# Patient Record
Sex: Female | Born: 1955 | Race: White | Hispanic: No | Marital: Married | Smoking: Never smoker
Health system: Southern US, Community
[De-identification: ages and names within clinical notes are randomized; demographics above are authoritative.]

## PROBLEM LIST (undated history)

## (undated) DIAGNOSIS — G43909 Migraine, unspecified, not intractable, without status migrainosus: Secondary | ICD-10-CM

## (undated) DIAGNOSIS — T7840XA Allergy, unspecified, initial encounter: Secondary | ICD-10-CM

## (undated) DIAGNOSIS — E785 Hyperlipidemia, unspecified: Secondary | ICD-10-CM

## (undated) DIAGNOSIS — F419 Anxiety disorder, unspecified: Secondary | ICD-10-CM

## (undated) DIAGNOSIS — G2581 Restless legs syndrome: Secondary | ICD-10-CM

## (undated) HISTORY — DX: Hyperlipidemia, unspecified: E78.5

## (undated) HISTORY — DX: Restless legs syndrome: G25.81

## (undated) HISTORY — PX: ABDOMINAL HYSTERECTOMY: SHX81

## (undated) HISTORY — DX: Migraine, unspecified, not intractable, without status migrainosus: G43.909

## (undated) HISTORY — DX: Allergy, unspecified, initial encounter: T78.40XA

## (undated) HISTORY — DX: Anxiety disorder, unspecified: F41.9

---

## 1998-06-22 ENCOUNTER — Other Ambulatory Visit: Admission: RE | Admit: 1998-06-22 | Discharge: 1998-06-22 | Payer: Self-pay | Admitting: Obstetrics and Gynecology

## 1999-06-27 ENCOUNTER — Other Ambulatory Visit: Admission: RE | Admit: 1999-06-27 | Discharge: 1999-06-27 | Payer: Self-pay | Admitting: Obstetrics and Gynecology

## 1999-08-03 ENCOUNTER — Encounter: Payer: Self-pay | Admitting: Surgery

## 1999-08-03 ENCOUNTER — Encounter: Admission: RE | Admit: 1999-08-03 | Discharge: 1999-08-03 | Payer: Self-pay | Admitting: Surgery

## 2000-06-17 ENCOUNTER — Other Ambulatory Visit: Admission: RE | Admit: 2000-06-17 | Discharge: 2000-06-17 | Payer: Self-pay | Admitting: Obstetrics and Gynecology

## 2000-08-05 ENCOUNTER — Encounter: Admission: RE | Admit: 2000-08-05 | Discharge: 2000-08-05 | Payer: Self-pay | Admitting: Surgery

## 2000-08-05 ENCOUNTER — Encounter: Payer: Self-pay | Admitting: Surgery

## 2001-06-24 ENCOUNTER — Other Ambulatory Visit: Admission: RE | Admit: 2001-06-24 | Discharge: 2001-06-24 | Payer: Self-pay | Admitting: Obstetrics and Gynecology

## 2001-08-06 ENCOUNTER — Encounter: Admission: RE | Admit: 2001-08-06 | Discharge: 2001-08-06 | Payer: Self-pay | Admitting: Surgery

## 2001-08-06 ENCOUNTER — Encounter: Payer: Self-pay | Admitting: Surgery

## 2002-08-11 ENCOUNTER — Encounter: Admission: RE | Admit: 2002-08-11 | Discharge: 2002-08-11 | Payer: Self-pay | Admitting: Obstetrics and Gynecology

## 2002-08-11 ENCOUNTER — Encounter: Payer: Self-pay | Admitting: Obstetrics and Gynecology

## 2003-08-10 ENCOUNTER — Encounter: Admission: RE | Admit: 2003-08-10 | Discharge: 2003-08-10 | Payer: Self-pay | Admitting: Family Medicine

## 2003-08-25 ENCOUNTER — Encounter: Admission: RE | Admit: 2003-08-25 | Discharge: 2003-08-25 | Payer: Self-pay | Admitting: Surgery

## 2004-09-07 ENCOUNTER — Encounter: Admission: RE | Admit: 2004-09-07 | Discharge: 2004-09-07 | Payer: Self-pay | Admitting: Surgery

## 2004-09-14 ENCOUNTER — Encounter: Admission: RE | Admit: 2004-09-14 | Discharge: 2004-09-14 | Payer: Self-pay | Admitting: Surgery

## 2005-09-24 ENCOUNTER — Encounter: Admission: RE | Admit: 2005-09-24 | Discharge: 2005-09-24 | Payer: Self-pay | Admitting: Surgery

## 2005-09-25 ENCOUNTER — Encounter: Admission: RE | Admit: 2005-09-25 | Discharge: 2005-09-25 | Payer: Self-pay | Admitting: Surgery

## 2006-09-26 ENCOUNTER — Ambulatory Visit (HOSPITAL_COMMUNITY): Admission: RE | Admit: 2006-09-26 | Discharge: 2006-09-27 | Payer: Self-pay | Admitting: Obstetrics and Gynecology

## 2006-09-26 ENCOUNTER — Encounter (INDEPENDENT_AMBULATORY_CARE_PROVIDER_SITE_OTHER): Payer: Self-pay | Admitting: Obstetrics and Gynecology

## 2006-10-08 ENCOUNTER — Encounter: Admission: RE | Admit: 2006-10-08 | Discharge: 2006-10-08 | Payer: Self-pay | Admitting: Surgery

## 2007-10-09 ENCOUNTER — Encounter: Admission: RE | Admit: 2007-10-09 | Discharge: 2007-10-09 | Payer: Self-pay | Admitting: Surgery

## 2008-10-11 ENCOUNTER — Encounter: Admission: RE | Admit: 2008-10-11 | Discharge: 2008-10-11 | Payer: Self-pay | Admitting: Surgery

## 2009-08-05 ENCOUNTER — Encounter: Admission: RE | Admit: 2009-08-05 | Discharge: 2009-08-05 | Payer: Self-pay | Admitting: Surgery

## 2009-09-06 ENCOUNTER — Ambulatory Visit (HOSPITAL_COMMUNITY): Admission: RE | Admit: 2009-09-06 | Discharge: 2009-09-06 | Payer: Self-pay | Admitting: Surgery

## 2009-10-12 ENCOUNTER — Encounter: Admission: RE | Admit: 2009-10-12 | Discharge: 2009-10-12 | Payer: Self-pay | Admitting: Surgery

## 2010-04-23 ENCOUNTER — Encounter: Payer: Self-pay | Admitting: Surgery

## 2010-08-15 NOTE — H&P (Signed)
NAME:  Cindy Combs, Cindy Combs              ACCOUNT NO.:  [id number]   MEDICAL RECORD NO.:  [id number]          PATIENT TYPE:  AMB   LOCATION:  SDC                           FACILITY:  [facility]   PHYSICIAN:  Malachi Pro. Ambrose Mantle, M.D. DATE OF BIRTH:  1956-01-06   DATE OF ADMISSION:  DATE OF DISCHARGE:                              HISTORY & PHYSICAL   PRESENT ILLNESS:  This is a 55 year old white female, 3-0-0-3, who was  admitted to the hospital for abdominal hysterectomy, bilateral salpingo-  oophorectomy because of persistent abnormal uterine bleeding  unresponsive to medical therapy and declining dilatation and curettage,  preferring to proceed with hysterectomy. This patient has not had any  bleeding in the last week or so. Prior to that she had been evaluated  for abnormal uterine bleeding in November  of 2007, at which time a  Pipelle of the endometrial cavity showed proliferative endometrium. At  that time she stated that she had had abnormal bleeding for 5 weeks, was  passing large clots, but some days her bleeding was light. She was  treated with progesterone for 10 days and on March 07, 2006, she  called back claiming to be bleeding and changing tampons every hour. She  was advised to check her hemoglobin and her hemoglobin was 10.5 in the  office but at [facility] was 11.1. She was advised to repeat it but did  not do so.   In April of 2008, the patient called complaining of heavy bleeding. She  requested a hemoglobin and she underwent a hemoglobin, it was 11.2,  hematocrit 33.5. She was advised iron pills and repeat her CBC in 6  weeks. On Aug 01, 2006, her husband called claiming that the patient had  been bleeding daily without stopping for 80 days. I saw her on May 6th  and discussed the situation with her. She claimed that she had been  bleeding daily for 80 days. She complained that while at work she would  stand up and gush blood out of her vagina. She also stated that she  would be  wiped out at work. A repeat hemoglobin Aug 29, 2006, was still  11.2. The patient was offered D and C and hysteroscopy or hysterectomy.  She chose to proceed with hysterectomy because over the last year and a  half we had done 2 endometrial biopsies and tried repeated bouts of  progesterone therapy without success. Her uterus seems to be somewhat  enlarged and a diagnosis of adenomyosis is entertained but not definite.   PAST MEDICAL HISTORY:  Reveals no known drug allergies, no latex  allergies. She has had no surgeries, no significant adult illnesses  other than the present problem with persistent abnormal uterine  bleeding.   REVIEW OF SYSTEMS:  No visual problems, no heart condition, no bowel or  urinary problems.   SOCIAL HISTORY:  The patient does not drink or smoke. She is a Psychologist, sport and exercise for [employer].   FAMILY HISTORY:  Her father is 56, healthy, but he does have macular  degeneration. Her mother is 72 and healthy. A  47 year old sister is  healthy. A 80 year old sister also is healthy and a 44 year old brother  is healthy.   The patient has 3 children, 55 year old female, 55 year old female and a  55 year old female all in good health.   MEDICATIONS:  The patient's  medications are  1. Zoloft.  2. Zocor.   PHYSICAL EXAMINATION:  GENERAL APPEARANCE: Well-developed, well-  nourished white female in no acute distress.  HEENT: Show no significant abnormalities. The nose and pharynx are  clear.  VITAL SIGNS: Her blood pressure is 140/84, weight is 173, pulse is 80.  NECK: The neck is supple without thyromegaly.  HEART: Normal size and sounds. No murmurs.  LUNGS: The lungs were clear to auscultation.  BREASTS: The breasts are soft without masses.  ABDOMEN: The abdomen is soft and flat. It is somewhat obese. It does not  pull up well so I may not do a closed laparoscopy. Liver, spleen and  kidneys are not felt.  PELVIC: On exam the vulva and vagina are  clean. There is a tight  subpubic angle, 80 to 90 degrees, rather than the usual 100 degrees to  120 degrees. The cervix is clean. The uterus is anterior, thought to be  upper limit of normal size. The adnexa are free of masses.  RECTAL: Exam a year ago was negative.  GU: Pap smear at that time was within normal limits.   ADMITTING IMPRESSION:  1. Persistent abnormal uterine bleeding in spite of repeated bouts of      progesterone therapy, probable adenomyosis. The patient is admitted      for attempted laparoscopic-assisted vaginal hysterectomy and      bilateral salpingo-oophorectomy. The patient understands that this      may be converted to a laparotomy. She does want her ovaries      removed. She has been counseled about the risks of surgery      including, but not limited to, heart attack, stroke, pulmonary      embolus, wound disruption, hemorrhage with need for reoperation      and/or transfusions, nerve injury and intestinal obstruction. She      also understands that the surgery may produce a decreased sex      drive, although the patient claims that her sex drive is low      anyway. She is ready to proceed.  2. Anemia.      Malachi Pro. Ambrose Mantle, M.D.  Electronically Signed     TFH/MEDQ  D:  09/25/2006  T:  09/25/2006  Job:  [id number]

## 2010-08-15 NOTE — Discharge Summary (Signed)
NAME:  Cindy Combs, Cindy Combs              ACCOUNT NO.:  [id number]   MEDICAL RECORD NO.:  [id number]          PATIENT TYPE:  OIB   LOCATION:  9304                          [facility]ACILITY:  [facility]   PHYSICIAN:  Malachi Pro. Ambrose Mantle, M.D. DATE OF BIRTH:  1955/07/05   DATE OF ADMISSION:  09/26/2006  DATE OF DISCHARGE:  09/27/2006                               DISCHARGE SUMMARY   A 55 year old white female with persistent menometrorrhagia in spite of  repeated courses of progesterone and anemia admitted for laparoscopic-  assisted vaginal hysterectomy and bilateral salpingo-oophorectomy.  The  patient underwent that procedure on September 26, 2006 by Dr. Ambrose Mantle with Dr.  Jackelyn Knife assisting under general anesthesia.  Postoperatively, the  patient did well.  She had no significant problems.  Her output was  good.  Her abdomen remained soft and nontender.  She did not pass  flatus, but she was tolerating liquids, ambulating well and voiding well  and was ready for discharge on the first postoperative day.  Initial  hemoglobin 11.4, hematocrit 34.7, white count 7600, platelet count  419,000.  Follow-up hematocrits 31.5 and 27.7.  Comprehensive metabolic  profile was normal except for glucose of 100 and a potassium of 3.2.  Urine pregnancy test was negative.   FINAL DIAGNOSES:  1. Recurrent and persistent menometrorrhagia.  2. Anemia.  3. Hypokalemia on admission.   OPERATION:  Laparoscopic-assisted vaginal hysterectomy and bilateral  salpingo-oophorectomy.   FINAL CONDITION:  Improved.   INSTRUCTIONS:  Include regular diet as soon as passing flatus.  Concentrate on clear liquids prior to that.  No heavy lifting or  strenuous activity.  Call with any temperature elevation greater than  100.4 degrees.  Call with any unusual problems.  Avoid vaginal entrance  for 6 weeks.  Percocet 5/325 36 tablets 1 every 4-6 hours as needed for  pain.  Continue her Zocor and Zoloft and return to the office in 10 days  for  follow-up examination.      Malachi Pro. Ambrose Mantle, M.D.  Electronically Signed     TFH/MEDQ  D:  09/27/2006  T:  09/27/2006  Job:  [id number]

## 2010-08-15 NOTE — Op Note (Signed)
NAME:  Cindy Combs, Cindy Combs              ACCOUNT NO.:  [id number]   MEDICAL RECORD NO.:  [id number]          PATIENT TYPE:  AMB   LOCATION:  SDC                           [facility]   PHYSICIAN:  Malachi Pro. Ambrose Mantle, M.D. DATE OF BIRTH:  [birth year]   DATE OF PROCEDURE:  09/26/2006  DATE OF DISCHARGE:                               OPERATIVE REPORT   PREOPERATIVE DIAGNOSIS:  Persistent abnormal uterine bleeding with  menorrhagia and menometrorrhagia, anemia, hemoglobin 11.2.   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   OPERATION:  Laparoscopic-assisted vaginal hysterectomy and BSO.   OPERATOR:  Dr. Ambrose Mantle   ASSISTANT:  Zenaida Niece, M.D.   ANESTHESIA:  General anesthesia.   DESCRIPTION OF PROCEDURE:  The patient was brought to the operating room  and placed under satisfactory general anesthesia. The abdomen, vulva,  vagina and urethra were prepped with Betadine solution.  The uterus was  anterior upper limit of normal size.  The adnexa were free of masses.  The cul-de-sac felt free. The bladder was drained with a Foley catheter  after prepping the urethra.  A Hulka cannula was placed into the uterus  and attached to the anterior cervical lip. The abdomen was then draped  as a sterile field and the laparoscopic-assisted vaginal hysterectomy  drape was used. A small incision was made in the inferior portion of the  umbilicus. A Veress cannula  was placed through the incisional opening  but I did not reach the peritoneal cavity, so the Excel trocar was  placed into the abdominal cavity by Dr. Jackelyn Knife under direct vision.  A pneumoperitoneum was established. Additional ports were placed  laterally at approximately just below the level of the umbilicus under  direct vision.  The survey of the abdominal  cavity showed the upper  abdomen to be free of disease and the liver looks smooth.  I did not see  the gallbladder. Exploration of the pelvis revealed the endometriosis  implants in the posterior cul-de-sac but they did not tent the rectum  anteriorly.  There was the implant of endometriosis in the right  anterior cul-de-sac.  The uterus itself appeared normal possibly upper  limit of normal size.  The right tube and ovary and the left tube and  ovary were normal.  Beginning on the right, the infundibulopelvic  ligament was desiccated and then cut. The right round ligament was  desiccated and cut. Parametrial tissues were handled in the same fashion  with the tripolar instrument.  There was some bleeding during this  procedure but it was controlled with coagulation. The identical  procedure was performed on the left and after we reached approximately  the mid uterus and hemostasis was adequate.  We went below placed the  patient in lithotomy position.  The cervix was exposed and drawn into  the operative field after the Hulka cannula was removed.  A dilute  solution of Neo-Synephrine was injected circumferentially around the  cervicovaginal junction.  An  incision was then made around the  cervicovaginal junction.  The bladder was pushed anteriorly.  The  posterior cul-de-sac was entered without difficulty. Uterosacral  ligaments were clamped, cut, suture ligated and held. The cardinal  ligaments were clamped, cut and suture ligated. Additional bite was  taken above there. There was no problem at this point. We did see blood  coming out of the abdominal cavity but not an excessive amount. Finally  I was able to connect the upper and lower sites and the uterus, tubes  and ovaries were removed.  I ran the posterior vaginal cuff with a  suture of 0 Vicryl in a locking fashion from uterosacral ligament to  uterosacral ligament.  I then made a search for hemostasis.  There  seemed to be a  little bit of bleeding along the right broad ligament  that was controlled with three figure-of-eight sutures of 0 Vicryl.  There did not appear to be any additional bleeding.  I sutured the  uterosacral ligaments together in the midline and then tied them the  midline using one of the ties from each side where the uterosacral  ligaments had been held. At this point hemostasis appeared adequate.  I  closed the vaginal cuff with multiple interrupted figure-of-eight  sutures in a vertical fashion.  Hemostasis was complete. I placed some  methylene blue stained fluid in the bladder.  We looked from above and  there was some blood clot high on the left approximately underneath  where the left trocar had been placed. We tried to remove as much of the  clot as could, saw no evidence of any active bleeding.  There was no  significant bleeding around the trocar itself.  We removed as much clot  from the abdominal cavity as possible, inspected all the surgical sites  starting with the left infundibulopelvic ligament.  The left round  ligament area.  The vaginal cuff.  The right round ligament and right  infundibulopelvic ligament areas and saw no problem. We could see the  right ureter peristalsing but I did not see the left. On the left side  there seemed to be no time when we were anywhere close to the ureter. At  this point the accessory trocar sites.  The trocars were removed.  There  was no bleeding from either port. I removed the umbilical trocar and  under direct vision saw there was no active bleeding. Identified the  fascia in the trocar site and sutured it together with a suture of 0  Vicryl.  I then closed the umbilical skin with 3-0 plain catgut  interrupted sutures and closed the accessory ports with Steri-Strips.  The patient seemed to tolerate the procedure well.  Blood loss was  estimated 300 mL.  Sponge and needle counts were correct and she was  returned to recovery in  satisfactory condition.  The urine was blue from  where it had been injected into the bladder.  There was no leakage seen  in the abdominal cavity and the urine in the Foley was completely yellow  and clear.      Malachi Pro. Ambrose Mantle, M.D.  Electronically Signed     TFH/MEDQ  D:  09/26/2006  T:  09/26/2006  Job:  [id number]

## 2010-08-25 ENCOUNTER — Ambulatory Visit: Payer: Self-pay | Admitting: Family Medicine

## 2010-09-21 ENCOUNTER — Other Ambulatory Visit (INDEPENDENT_AMBULATORY_CARE_PROVIDER_SITE_OTHER): Payer: Self-pay | Admitting: Surgery

## 2010-09-21 DIAGNOSIS — Z1231 Encounter for screening mammogram for malignant neoplasm of breast: Secondary | ICD-10-CM

## 2010-10-26 ENCOUNTER — Ambulatory Visit
Admission: RE | Admit: 2010-10-26 | Discharge: 2010-10-26 | Disposition: A | Discharge status: Home / Self Care | Payer: BC Managed Care – PPO | Source: Ambulatory Visit | Attending: Surgery | Admitting: Surgery

## 2010-10-26 DIAGNOSIS — Z1231 Encounter for screening mammogram for malignant neoplasm of breast: Secondary | ICD-10-CM

## 2011-01-17 LAB — COMPREHENSIVE METABOLIC PANEL
ALT: 14
AST: 25
Alkaline Phosphatase: 38 — ABNORMAL LOW
BUN: 9
Calcium: 8.8
GFR calc non Af Amer: >60
Total Bilirubin: 0.6

## 2011-01-17 LAB — DIFFERENTIAL
Basophils Absolute: 0.1
Eosinophils Absolute: 0.3
Lymphocytes Relative: 24
Lymphs Abs: 1.8
Monocytes Relative: 5
Neutro Abs: 5.1

## 2011-01-17 LAB — CBC
HCT: 27.7 — ABNORMAL LOW
HCT: 31.5 — ABNORMAL LOW
HCT: 34.7 — ABNORMAL LOW
Hemoglobin: 10.2 — ABNORMAL LOW
MCHC: 32.9
MCV: 81.8
MCV: 82.4
Platelets: 365
Platelets: 419 — ABNORMAL HIGH
RBC: 3.36 — ABNORMAL LOW
RDW: 14.5 — ABNORMAL HIGH
WBC: 12.8 — ABNORMAL HIGH

## 2011-01-17 LAB — PREGNANCY, URINE: Preg Test, Ur: NEGATIVE

## 2011-05-16 ENCOUNTER — Other Ambulatory Visit (INDEPENDENT_AMBULATORY_CARE_PROVIDER_SITE_OTHER): Payer: Self-pay | Admitting: Surgery

## 2011-05-16 ENCOUNTER — Encounter (INDEPENDENT_AMBULATORY_CARE_PROVIDER_SITE_OTHER): Payer: Self-pay | Admitting: Surgery

## 2011-05-16 MED ORDER — HYDROCOD POLST-CHLORPHEN POLST 10-8 MG/5ML PO LQCR: 5.0000 mL | Freq: Two times a day (BID) | ORAL | Status: DC

## 2011-05-16 NOTE — Progress Notes (Signed)
Patient ID: Cindy Combs, female   DOB: [birth year], 56 y.o.   MRN: [id number] URI with fever, cough, congestion.  Better but coughs incessantly when lying down Will Rx Tussinex.  Daphine Deutscher

## 2011-10-23 ENCOUNTER — Other Ambulatory Visit (INDEPENDENT_AMBULATORY_CARE_PROVIDER_SITE_OTHER): Payer: Self-pay | Admitting: Surgery

## 2011-10-23 DIAGNOSIS — Z1231 Encounter for screening mammogram for malignant neoplasm of breast: Secondary | ICD-10-CM

## 2011-10-30 ENCOUNTER — Ambulatory Visit: Payer: BC Managed Care – PPO

## 2011-11-02 ENCOUNTER — Ambulatory Visit: Payer: BC Managed Care – PPO

## 2011-11-08 ENCOUNTER — Ambulatory Visit
Admission: RE | Admit: 2011-11-08 | Discharge: 2011-11-08 | Disposition: A | Discharge status: Home / Self Care | Payer: PRIVATE HEALTH INSURANCE | Source: Ambulatory Visit | Attending: Surgery | Admitting: Surgery

## 2011-11-08 DIAGNOSIS — Z1231 Encounter for screening mammogram for malignant neoplasm of breast: Secondary | ICD-10-CM

## 2012-04-21 ENCOUNTER — Ambulatory Visit: Payer: PRIVATE HEALTH INSURANCE | Admitting: Family Medicine

## 2012-04-22 ENCOUNTER — Encounter: Payer: Self-pay | Admitting: Family Medicine

## 2012-04-22 ENCOUNTER — Ambulatory Visit (INDEPENDENT_AMBULATORY_CARE_PROVIDER_SITE_OTHER): Payer: PRIVATE HEALTH INSURANCE | Admitting: Family Medicine

## 2012-04-22 VITALS — BP 120/84 | Temp 97.9°F | Ht 62.25 in | Wt 196.0 lb

## 2012-04-22 DIAGNOSIS — J3489 Other specified disorders of nose and nasal sinuses: Secondary | ICD-10-CM

## 2012-04-22 DIAGNOSIS — J309 Allergic rhinitis, unspecified: Secondary | ICD-10-CM

## 2012-04-22 MED ORDER — PREDNISONE 20 MG PO TABS: ORAL_TABLET | ORAL | Status: DC

## 2012-04-22 NOTE — Patient Instructions (Signed)
Take the prednisone as directed  I would also recommend you take at the 57 year old carpet in your bedroom  Call Dr. Narda Bonds for an ENT consultation because of the marked edema of the turbinates

## 2012-04-22 NOTE — Progress Notes (Signed)
  Subjective:    Patient ID: Cindy Combs, female    DOB: [birth year], 57 y.o.   MRN: [id number]  HPI Cindy Combs  is a 57 year old married female nonsmoker who comes in today as a new patient,,,,,,,, her husband Jonny Ruiz is a patient of mine,,,,,,, for evaluation of recurrent "" sinusitis"  She states she has a history of allergic rhinitis and has been on allergy shots in the past. At one time she was told she also might have asthma.  In October she went to an urgent care clinic with symptoms of head congestion and was given amoxicillin this did not really help. She then went to see Dr. Archer Callas the allergist who gave her Avelox and a shot of prednisone. This really helped her symptoms. But it wasn't very long before she was stopped up again. She then went to an another urgent care  and was given Augmentin. This also did not help.  She's always been in excellent health she said no chronic health problems. She has 3 children prior to her TAH and BSO for nonmalignant reasons. She has a history of migraine headaches for which she takes Relpax when necessary  She takes Zoloft 50 mg each bedtime from Dr. 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   Review of Systems Review of systems otherwise negative except for trauma of her nose when she was a child also they have carpeting in the bedroom its about 57 years old    Objective:   Physical Exam Well-developed well-nourished female no acute distress examination of the HEENT was negative except the absence of the septum. She has marked nasal turbinate edema. The neck was supple thyroid was not enlarged no adenopathy lungs are clear  Also did a skin exam of her back chest and abdomen all of which were normal except for scar on her left areola where she had a lesion removed years ago by Dr. 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

## 2012-05-05 ENCOUNTER — Encounter (INDEPENDENT_AMBULATORY_CARE_PROVIDER_SITE_OTHER): Payer: PRIVATE HEALTH INSURANCE | Admitting: Ophthalmology

## 2012-05-05 DIAGNOSIS — H33309 Unspecified retinal break, unspecified eye: Secondary | ICD-10-CM

## 2012-05-05 DIAGNOSIS — H3530 Unspecified macular degeneration: Secondary | ICD-10-CM

## 2012-05-05 DIAGNOSIS — H2510 Age-related nuclear cataract, unspecified eye: Secondary | ICD-10-CM

## 2012-05-05 DIAGNOSIS — H43819 Vitreous degeneration, unspecified eye: Secondary | ICD-10-CM

## 2012-05-13 ENCOUNTER — Ambulatory Visit (INDEPENDENT_AMBULATORY_CARE_PROVIDER_SITE_OTHER): Payer: PRIVATE HEALTH INSURANCE | Admitting: Ophthalmology

## 2012-05-13 DIAGNOSIS — H33309 Unspecified retinal break, unspecified eye: Secondary | ICD-10-CM

## 2012-05-14 ENCOUNTER — Ambulatory Visit (INDEPENDENT_AMBULATORY_CARE_PROVIDER_SITE_OTHER): Payer: PRIVATE HEALTH INSURANCE | Admitting: Ophthalmology

## 2012-05-26 ENCOUNTER — Ambulatory Visit (INDEPENDENT_AMBULATORY_CARE_PROVIDER_SITE_OTHER): Payer: PRIVATE HEALTH INSURANCE | Admitting: Ophthalmology

## 2012-05-30 ENCOUNTER — Other Ambulatory Visit: Payer: Self-pay | Admitting: Otolaryngology

## 2012-05-30 DIAGNOSIS — J329 Chronic sinusitis, unspecified: Secondary | ICD-10-CM

## 2012-06-03 ENCOUNTER — Ambulatory Visit
Admission: RE | Admit: 2012-06-03 | Discharge: 2012-06-03 | Disposition: A | Discharge status: Home / Self Care | Payer: PRIVATE HEALTH INSURANCE | Source: Ambulatory Visit | Attending: Otolaryngology | Admitting: Otolaryngology

## 2012-06-03 DIAGNOSIS — J329 Chronic sinusitis, unspecified: Secondary | ICD-10-CM

## 2012-07-17 ENCOUNTER — Other Ambulatory Visit: Payer: Self-pay | Admitting: Otolaryngology

## 2012-09-12 ENCOUNTER — Ambulatory Visit (INDEPENDENT_AMBULATORY_CARE_PROVIDER_SITE_OTHER): Payer: PRIVATE HEALTH INSURANCE | Admitting: Ophthalmology

## 2012-09-18 ENCOUNTER — Ambulatory Visit (INDEPENDENT_AMBULATORY_CARE_PROVIDER_SITE_OTHER): Payer: Self-pay | Admitting: Ophthalmology

## 2012-09-30 ENCOUNTER — Ambulatory Visit (INDEPENDENT_AMBULATORY_CARE_PROVIDER_SITE_OTHER): Payer: PRIVATE HEALTH INSURANCE | Admitting: Ophthalmology

## 2012-09-30 DIAGNOSIS — H43819 Vitreous degeneration, unspecified eye: Secondary | ICD-10-CM

## 2012-09-30 DIAGNOSIS — H33309 Unspecified retinal break, unspecified eye: Secondary | ICD-10-CM

## 2012-09-30 DIAGNOSIS — H2510 Age-related nuclear cataract, unspecified eye: Secondary | ICD-10-CM

## 2012-09-30 DIAGNOSIS — H3530 Unspecified macular degeneration: Secondary | ICD-10-CM

## 2012-10-02 ENCOUNTER — Other Ambulatory Visit: Payer: Self-pay

## 2012-10-02 DIAGNOSIS — Z1231 Encounter for screening mammogram for malignant neoplasm of breast: Secondary | ICD-10-CM

## 2012-11-10 ENCOUNTER — Ambulatory Visit
Admission: RE | Admit: 2012-11-10 | Discharge: 2012-11-10 | Disposition: A | Discharge status: Home / Self Care | Payer: PRIVATE HEALTH INSURANCE | Source: Ambulatory Visit

## 2012-11-10 DIAGNOSIS — Z1231 Encounter for screening mammogram for malignant neoplasm of breast: Secondary | ICD-10-CM

## 2013-09-22 ENCOUNTER — Other Ambulatory Visit: Payer: Self-pay

## 2013-09-22 DIAGNOSIS — Z1231 Encounter for screening mammogram for malignant neoplasm of breast: Secondary | ICD-10-CM

## 2013-11-12 ENCOUNTER — Ambulatory Visit
Admission: RE | Admit: 2013-11-12 | Discharge: 2013-11-12 | Disposition: A | Discharge status: Home / Self Care | Payer: PRIVATE HEALTH INSURANCE | Source: Ambulatory Visit

## 2013-11-12 DIAGNOSIS — Z1231 Encounter for screening mammogram for malignant neoplasm of breast: Secondary | ICD-10-CM

## 2013-12-24 ENCOUNTER — Other Ambulatory Visit: Payer: Self-pay | Admitting: Family Medicine

## 2013-12-24 DIAGNOSIS — R1013 Epigastric pain: Secondary | ICD-10-CM

## 2013-12-30 ENCOUNTER — Ambulatory Visit
Admission: RE | Admit: 2013-12-30 | Discharge: 2013-12-30 | Disposition: A | Discharge status: Home / Self Care | Payer: PRIVATE HEALTH INSURANCE | Source: Ambulatory Visit | Attending: Family Medicine | Admitting: Family Medicine

## 2013-12-30 DIAGNOSIS — R1013 Epigastric pain: Secondary | ICD-10-CM

## 2014-01-04 ENCOUNTER — Other Ambulatory Visit (HOSPITAL_COMMUNITY): Payer: Self-pay | Admitting: Family Medicine

## 2014-01-04 DIAGNOSIS — N951 Menopausal and female climacteric states: Secondary | ICD-10-CM

## 2014-01-04 DIAGNOSIS — R1013 Epigastric pain: Secondary | ICD-10-CM

## 2014-01-18 ENCOUNTER — Encounter (HOSPITAL_COMMUNITY): Payer: Self-pay

## 2014-01-18 ENCOUNTER — Ambulatory Visit (HOSPITAL_COMMUNITY)
Admission: RE | Admit: 2014-01-18 | Discharge: 2014-01-18 | Disposition: A | Discharge status: Home / Self Care | Payer: PRIVATE HEALTH INSURANCE | Source: Ambulatory Visit | Attending: Family Medicine | Admitting: Family Medicine

## 2014-01-18 ENCOUNTER — Other Ambulatory Visit (HOSPITAL_COMMUNITY): Payer: Self-pay | Admitting: Family Medicine

## 2014-01-18 DIAGNOSIS — R1013 Epigastric pain: Secondary | ICD-10-CM | POA: Insufficient documentation

## 2014-01-18 DIAGNOSIS — N951 Menopausal and female climacteric states: Secondary | ICD-10-CM

## 2014-01-18 MED ORDER — TECHNETIUM TC 99M MEBROFENIN IV KIT
5.1000 | PACK | Freq: Once | INTRAVENOUS | Status: AC | PRN
  Administered 2014-01-18: 5.1 via INTRAVENOUS

## 2014-02-01 ENCOUNTER — Encounter (HOSPITAL_COMMUNITY): Payer: Self-pay

## 2014-07-15 DIAGNOSIS — H353190 Nonexudative age-related macular degeneration, unspecified eye, stage unspecified: Secondary | ICD-10-CM | POA: Insufficient documentation

## 2014-07-15 DIAGNOSIS — H33311 Horseshoe tear of retina without detachment, right eye: Secondary | ICD-10-CM | POA: Insufficient documentation

## 2014-07-15 DIAGNOSIS — IMO0002 Reserved for concepts with insufficient information to code with codable children: Secondary | ICD-10-CM | POA: Insufficient documentation

## 2014-07-15 DIAGNOSIS — H40003 Preglaucoma, unspecified, bilateral: Secondary | ICD-10-CM | POA: Insufficient documentation

## 2014-10-26 ENCOUNTER — Other Ambulatory Visit: Payer: Self-pay

## 2014-10-26 DIAGNOSIS — Z1231 Encounter for screening mammogram for malignant neoplasm of breast: Secondary | ICD-10-CM

## 2014-11-30 ENCOUNTER — Ambulatory Visit
Admission: RE | Admit: 2014-11-30 | Discharge: 2014-11-30 | Disposition: A | Discharge status: Home / Self Care | Payer: PRIVATE HEALTH INSURANCE | Source: Ambulatory Visit

## 2014-11-30 DIAGNOSIS — Z1231 Encounter for screening mammogram for malignant neoplasm of breast: Secondary | ICD-10-CM

## 2015-01-20 ENCOUNTER — Ambulatory Visit (INDEPENDENT_AMBULATORY_CARE_PROVIDER_SITE_OTHER): Payer: PRIVATE HEALTH INSURANCE | Admitting: Podiatry

## 2015-01-20 ENCOUNTER — Ambulatory Visit (INDEPENDENT_AMBULATORY_CARE_PROVIDER_SITE_OTHER): Payer: PRIVATE HEALTH INSURANCE

## 2015-01-20 ENCOUNTER — Encounter: Payer: Self-pay | Admitting: Podiatry

## 2015-01-20 VITALS — BP 137/83 | HR 82 | Resp 16

## 2015-01-20 DIAGNOSIS — M722 Plantar fascial fibromatosis: Secondary | ICD-10-CM | POA: Diagnosis not present

## 2015-01-20 MED ORDER — METHYLPREDNISOLONE 4 MG PO TBPK: ORAL_TABLET | ORAL | Status: DC

## 2015-01-20 MED ORDER — MELOXICAM 15 MG PO TABS: 15.0000 mg | ORAL_TABLET | Freq: Every day | ORAL | Status: DC

## 2015-01-20 NOTE — Progress Notes (Signed)
   Subjective:    Patient ID: Cindy Combs, female    DOB: 1955-10-13, 59 y.o.   MRN: [id number]  HPI : she presents today with a chief complaint of pain to her left heel. She states has been present for greater than a year now. She tries ibuprofen when her foot is painful has noticed that she started to develop some swelling around the ankle. She is also noticed lately she has pain to the lateral aspect of the left foot.    Review of Systems  All other systems reviewed and are negative.      Objective:   Physical Exam : 59 year old white female vital signs stable alert and oriented 3. Pulses are strongly palpable. Neurologic sensorium is intact percent once the monofilament. Deep tendon reflexes are intact. Muscle strength +5 over 5 dorsiflexion plantarflexion and inversion everters all intrinsic musculature is intact. Orthopedic evaluation demonstrates rectus foot type. Pain on palpation medial calcaneal tubercle of the left heel. Some tenderness on palpation of the fourth fifth met cuboid articulation. Radiographs taken today 3 views left foot does demonstrate soft tissue increase in density at the plantar fascial calcaneal insertion site and the small plantar distally oriented calcaneal heel spur. Cutaneous evaluation demonstrates supple well-hydrated cutis no erythema edema cellulitis drainage or odor. No open lesions or wounds.        Assessment & Plan:   plantar fasciitis chronic in nature left foot. Lateral compensatory syndrome left foot.  Plan: discussed etiology pathology conservative versus surgical therapies. Injected the left heel today with Kenalog and local anesthetic dispensed a plantar fascial Combs. Dispensed a prescription for Medrol Dosepak to be followed by meloxicam. She has a plantar fascial Combs at home. We discussed appropriate shoe gear stretching exercises ice therapy as she modifications. I will follow-up with her in 1 month.  Roselind Messier DPM

## 2015-01-20 NOTE — Patient Instructions (Signed)

## 2015-02-08 ENCOUNTER — Encounter: Payer: Self-pay | Admitting: Podiatry

## 2015-02-08 ENCOUNTER — Ambulatory Visit (INDEPENDENT_AMBULATORY_CARE_PROVIDER_SITE_OTHER): Payer: PRIVATE HEALTH INSURANCE | Admitting: Podiatry

## 2015-02-08 ENCOUNTER — Ambulatory Visit: Payer: PRIVATE HEALTH INSURANCE | Admitting: Podiatry

## 2015-02-08 VITALS — BP 124/64 | HR 80 | Resp 16

## 2015-02-08 DIAGNOSIS — M722 Plantar fascial fibromatosis: Secondary | ICD-10-CM | POA: Diagnosis not present

## 2015-02-08 NOTE — Progress Notes (Signed)
She presents today for follow-up of her plantar fasciitis states she is doing considerably better that it does hurt is a little bit. She continues all conservative therapies at this point.  Objective: Vital signs are stable she is alert and oriented 3. Pulses are strongly palpable. She has pain on palpation medial calcaneal tubercle of her left heel.  Assessment: Plantar fasciitis slowly resolving left heel.  Plan: Reinjected her left heel today and she will continue all conservative therapies including plantar fascial brace night splint and oral anti-inflammatories.

## 2015-03-10 ENCOUNTER — Ambulatory Visit: Payer: PRIVATE HEALTH INSURANCE | Admitting: Podiatry

## 2015-04-05 ENCOUNTER — Ambulatory Visit: Payer: PRIVATE HEALTH INSURANCE | Admitting: Podiatry

## 2015-04-19 ENCOUNTER — Ambulatory Visit (INDEPENDENT_AMBULATORY_CARE_PROVIDER_SITE_OTHER): Payer: PRIVATE HEALTH INSURANCE | Admitting: Podiatry

## 2015-04-19 ENCOUNTER — Encounter: Payer: Self-pay | Admitting: Podiatry

## 2015-04-19 VITALS — BP 135/68 | HR 78 | Resp 16

## 2015-04-19 DIAGNOSIS — M722 Plantar fascial fibromatosis: Secondary | ICD-10-CM

## 2015-04-19 MED ORDER — DICLOFENAC SODIUM 75 MG PO TBEC: 75.0000 mg | DELAYED_RELEASE_TABLET | Freq: Two times a day (BID) | ORAL | Status: DC

## 2015-04-19 NOTE — Progress Notes (Signed)
She presents for follow-up of long-term plantar fasciitis left foot. She states it is really not doing very good. She states that he was doing fantastic and then has recently regressed to about 0%. She states that she is getting very tired of this and would like to consider something else.  Objective: Vital signs are stable she is alert and oriented 3 pulses are palpable left foot. No calf pain. She has pain on palpation medial calcaneal tubercle of the left heel. No open wounds or lesions.  Assessment: Chronic intractable plantar fasciitis left foot. Not responding to conservative therapies.  Plan: Discussed etiology pathology conservative versus surgical therapies. At this point we decided on another injection to the left heel and she was scanned for several orthotics. We also switched from meloxicam to diclofenac and I will follow up with her once her orthotics come in. We did discuss the possible need for surgical correction.

## 2015-04-20 ENCOUNTER — Telehealth: Payer: Self-pay | Admitting: *Deleted

## 2015-04-20 NOTE — Telephone Encounter (Signed)
Pt states Dr. Milinda Pointer was to change her Mobic to another medication but it was not called to the pharmacy yet.  I reviewed pt's medication orders and Diclofenac was called to [facility] at 449pm on 04/19/2015.  Unable to leave a message because the [employer] customer had not set up voicemail.

## 2015-05-10 ENCOUNTER — Ambulatory Visit: Payer: PRIVATE HEALTH INSURANCE | Admitting: *Deleted

## 2015-05-10 DIAGNOSIS — M722 Plantar fascial fibromatosis: Secondary | ICD-10-CM

## 2015-05-10 NOTE — Patient Instructions (Signed)

## 2015-05-10 NOTE — Progress Notes (Signed)
Patient ID: Cindy Combs, female   DOB: 12-11-1955, 60 y.o.   MRN: [id number] Patient presents for orthotic pick up.  Verbal and written break in and wear instructions given.  Patient will follow up in 4 weeks if symptoms worsen or fail to improve.

## 2015-07-05 ENCOUNTER — Ambulatory Visit (INDEPENDENT_AMBULATORY_CARE_PROVIDER_SITE_OTHER): Payer: PRIVATE HEALTH INSURANCE | Admitting: Podiatry

## 2015-07-05 DIAGNOSIS — M722 Plantar fascial fibromatosis: Secondary | ICD-10-CM | POA: Diagnosis not present

## 2015-07-05 MED ORDER — OXYCODONE-ACETAMINOPHEN 10-325 MG PO TABS: 1.0000 | ORAL_TABLET | Freq: Four times a day (QID) | ORAL | Status: DC | PRN

## 2015-07-05 MED ORDER — CELECOXIB 200 MG PO CAPS: 200.0000 mg | ORAL_CAPSULE | Freq: Two times a day (BID) | ORAL | Status: DC

## 2015-07-05 NOTE — Progress Notes (Signed)
She presents today for follow-up of her orthotics left foot. She states that I am able to wear the orthotics a lot without a lot of discomfort however I'm still having pain to the lateral aspect of the left foot. It becomes so painful that I can hardly walk and very frustrated about it.  Objective: Vital signs are stable alert and oriented 3. She has severe pain on palpation of fifth metatarsal base of the left foot as well as the peroneal tendinitis. The majority of the plantar fasciitis has resolved with little tenderness on palpation of the medial calcaneal tubercle. Pain is noted.  Assessment: She is making progress with the plantar fasciitis however the lateral compensatory syndrome with mild. Tendinitis is starting to become painful.  Plan: At this point I wrote a prescription for Percocet as well as Celebrex. I encouraged her to take this to help to break the pain cycle as needed. She is to take Celebrex daily. I injected the lateral aspect of the foot today along the lateral heel and into the fifth metatarsal base area. This was injected with Kenalog and local anesthetic and I will follow-up with her in 1 month.

## 2015-07-26 ENCOUNTER — Ambulatory Visit: Payer: PRIVATE HEALTH INSURANCE | Admitting: Podiatry

## 2015-08-02 ENCOUNTER — Ambulatory Visit (INDEPENDENT_AMBULATORY_CARE_PROVIDER_SITE_OTHER): Payer: PRIVATE HEALTH INSURANCE | Admitting: Podiatry

## 2015-08-02 ENCOUNTER — Encounter: Payer: Self-pay | Admitting: Podiatry

## 2015-08-02 ENCOUNTER — Telehealth: Payer: Self-pay | Admitting: *Deleted

## 2015-08-02 DIAGNOSIS — M722 Plantar fascial fibromatosis: Secondary | ICD-10-CM

## 2015-08-02 DIAGNOSIS — S86312D Strain of muscle(s) and tendon(s) of peroneal muscle group at lower leg level, left leg, subsequent encounter: Secondary | ICD-10-CM

## 2015-08-02 NOTE — Progress Notes (Signed)
She presents today for follow-up of pain to her left peroneal tendon as well as her plantar fascia. She states that she is not getting significantly better.  Objective: Vital signs are stable she is alert and oriented 3. She has pain on palpation of the peroneal tendons as well as the plantar fascia left heel.  Assessment: Cannot rule out a peroneal tendon tear secondary to compensation from plantar fasciitis left.  Plan: Requested an MRI for evaluation the peroneal tendons and the fascia.

## 2015-08-02 NOTE — Telephone Encounter (Signed)
Orders in EPIC and given to D. Meadows for [employer].

## 2015-08-07 ENCOUNTER — Other Ambulatory Visit: Payer: PRIVATE HEALTH INSURANCE

## 2015-08-09 NOTE — Telephone Encounter (Signed)
"  I'm calling to inform you MRI has been approved the authorization number is the same as the case number, [id number].  It is good from 08/09/2015 to 11/07/2015."

## 2015-08-09 NOTE — Telephone Encounter (Signed)
Clinicals were requested for determination of medical necessity.  I faxed clinicals to [facility] [employer].

## 2015-08-15 ENCOUNTER — Ambulatory Visit
Admission: RE | Admit: 2015-08-15 | Discharge: 2015-08-15 | Disposition: A | Discharge status: Home / Self Care | Payer: PRIVATE HEALTH INSURANCE | Source: Ambulatory Visit | Attending: Podiatry | Admitting: Podiatry

## 2015-08-15 ENCOUNTER — Telehealth: Payer: Self-pay | Admitting: *Deleted

## 2015-08-15 DIAGNOSIS — S86312D Strain of muscle(s) and tendon(s) of peroneal muscle group at lower leg level, left leg, subsequent encounter: Secondary | ICD-10-CM

## 2015-08-15 DIAGNOSIS — M722 Plantar fascial fibromatosis: Secondary | ICD-10-CM

## 2015-08-15 NOTE — Telephone Encounter (Addendum)
-----   Message from Cindy Combs, [place] sent at 08/15/2015 12:04 PM EDT ----- Positive plantar fasciitis.  Have her in to discuss surgery.  Pt is scheduled for follow up appt 08/23/2015.

## 2015-08-23 ENCOUNTER — Ambulatory Visit (INDEPENDENT_AMBULATORY_CARE_PROVIDER_SITE_OTHER): Payer: PRIVATE HEALTH INSURANCE | Admitting: Podiatry

## 2015-08-23 ENCOUNTER — Encounter: Payer: Self-pay | Admitting: Podiatry

## 2015-08-23 DIAGNOSIS — S86312D Strain of muscle(s) and tendon(s) of peroneal muscle group at lower leg level, left leg, subsequent encounter: Secondary | ICD-10-CM

## 2015-08-23 DIAGNOSIS — M722 Plantar fascial fibromatosis: Secondary | ICD-10-CM

## 2015-08-23 NOTE — Patient Instructions (Signed)
Pre-Operative Instructions  Congratulations, you have decided to take an important step to improving your quality of life.  You can be assured that the doctors of [facility] will be with you every step of the way.  1. Plan to be at the surgery center/hospital at least 1 (one) hour prior to your scheduled time unless otherwise directed by the surgical center/hospital staff.  You must have a responsible adult accompany you, remain during the surgery and drive you home.  Make sure you have directions to the surgical center/hospital and know how to get there on time. 2. For hospital based surgery you will need to obtain a history and physical form from your family physician within 1 month prior to the date of surgery- we will give you a form for you primary physician.  3. We make every effort to accommodate the date you request for surgery.  There are however, times where surgery dates or times have to be moved.  We will contact you as soon as possible if a change in schedule is required.   4. No Aspirin/Ibuprofen for one week before surgery.  If you are on aspirin, any non-steroidal anti-inflammatory medications (Mobic, Aleve, Ibuprofen) you should stop taking it 7 days prior to your surgery.  You make take Tylenol  For pain prior to surgery.  5. Medications- If you are taking daily heart and blood pressure medications, seizure, reflux, allergy, asthma, anxiety, pain or diabetes medications, make sure the surgery center/hospital is aware before the day of surgery so they may notify you which medications to take or avoid the day of surgery. 6. No food or drink after midnight the night before surgery unless directed otherwise by surgical center/hospital staff. 7. No alcoholic beverages 24 hours prior to surgery.  No smoking 24 hours prior to or 24 hours after surgery. 8. Wear loose pants or shorts- loose enough to fit over bandages, boots, and casts. 9. No slip on shoes, sneakers are best. 10. Bring  your boot with you to the surgery center/hospital.  Also bring crutches or a walker if your physician has prescribed it for you.  If you do not have this equipment, it will be provided for you after surgery. 11. If you have not been contracted by the surgery center/hospital by the day before your surgery, call to confirm the date and time of your surgery. 12. Leave-time from work may vary depending on the type of surgery you have.  Appropriate arrangements should be made prior to surgery with your employer. 13. Prescriptions will be provided immediately following surgery by your doctor.  Have these filled as soon as possible after surgery and take the medication as directed. 14. Remove nail polish on the operative foot. 15. Wash the night before surgery.  The night before surgery wash the foot and leg well with the antibacterial soap provided and water paying special attention to beneath the toenails and in between the toes.  Rinse thoroughly with water and dry well with a towel.  Perform this wash unless told not to do so by your physician.  Enclosed: 1 Ice pack (please put in freezer the night before surgery)   1 Hibiclens skin cleaner   Pre-op Instructions  If you have any questions regarding the instructions, do not hesitate to call our office.  [place]: [address] [place], [place] [address] [phone]  [place]: [address] [place], [place] [address] [phone]  [place]: [address]  [place], [place] [address] [phone]   Dr.   Norman Regal DPM, Dr. Matthew Wagoner DPM, Dr. M. Todd Hyatt DPM, Dr. Titorya Stover DPM 

## 2015-08-24 NOTE — Progress Notes (Signed)
She presents today for with a chief complaint of continued pain to her left foot. She had requested an MRI be performed prior to any surgical evaluation.  Objective: Vital signs are stable she is alert and oriented 3 she still has pain on palpation medial calcaneal tubercle of her left heel. MRI does demonstrate severe plantar fasciitis medial band of the plantar fascia left foot. No other major osseous abnormalities.  Assessment: Plantar fasciitis left.  Plan: We consented her today for an endoscopic plantar fasciotomy of her left heel. We discussed possible postop complications which may include but are not limited to postop pain bleeding swelling infection recurrence and need for further surgery overcorrection under correction collapse of the arch lateral cuboid impingement syndrome with osteoarthritic changes, loss of digit loss of limb loss of life. Discussed the surgery such as these could develop DVT or pulmonary emboli. We dispensed a cam walker for her today as well as paperwork for the surgical facility and anesthesia group.

## 2015-08-31 ENCOUNTER — Other Ambulatory Visit: Payer: Self-pay | Admitting: Podiatry

## 2015-08-31 MED ORDER — CEPHALEXIN 500 MG PO CAPS: 500.0000 mg | ORAL_CAPSULE | Freq: Three times a day (TID) | ORAL | Status: DC

## 2015-08-31 MED ORDER — OXYCODONE-ACETAMINOPHEN 10-325 MG PO TABS: 1.0000 | ORAL_TABLET | Freq: Four times a day (QID) | ORAL | Status: DC | PRN

## 2015-08-31 MED ORDER — PROMETHAZINE HCL 25 MG PO TABS: 25.0000 mg | ORAL_TABLET | Freq: Three times a day (TID) | ORAL | Status: DC | PRN

## 2015-09-02 ENCOUNTER — Encounter: Payer: Self-pay | Admitting: Podiatry

## 2015-09-02 DIAGNOSIS — M722 Plantar fascial fibromatosis: Secondary | ICD-10-CM | POA: Diagnosis not present

## 2015-09-05 ENCOUNTER — Telehealth: Payer: Self-pay | Admitting: *Deleted

## 2015-09-05 NOTE — Telephone Encounter (Signed)
Post op courtesy call-Pt states she's done great hasn't taken much of the pain medication, just resting.  I reiterated not being up on the surgical foot or dangling more than 15 mins/hour, remain in the boot to walk, leave the surgical dressing in place until the 1st POV, and ice for swelling or pain, and call with concerns.

## 2015-09-08 ENCOUNTER — Encounter: Payer: Self-pay | Admitting: Podiatry

## 2015-09-08 ENCOUNTER — Ambulatory Visit (INDEPENDENT_AMBULATORY_CARE_PROVIDER_SITE_OTHER): Payer: PRIVATE HEALTH INSURANCE | Admitting: Podiatry

## 2015-09-08 VITALS — BP 132/82 | HR 72 | Resp 16

## 2015-09-08 DIAGNOSIS — Z9889 Other specified postprocedural states: Secondary | ICD-10-CM

## 2015-09-08 DIAGNOSIS — M722 Plantar fascial fibromatosis: Secondary | ICD-10-CM

## 2015-09-08 NOTE — Progress Notes (Signed)
She presents today for follow-up of her endoscopic plantar fasciotomy of her left heel date of surgery 09/02/2015. She states that she is doing very well and feels just fine no complications.  Objective: Vital signs are stable she is alert and oriented 3. No erythema edema cellulitis drainage or odor. Pulses are strongly palpable. Dry sterile dressing removed does demonstrate minimal edema and no erythema cellulitis drainage or odor sutures are intact. Minimal pain on palpation.  Assessment: Well surgical foot left.  Plan: I placed her in light dressings today and will allow her to start washing her foot. She will soak in Epsom salts and warm water after showering. She is to continue the use of cam walker at all other times including bedtime. I will follow-up with her in 1 week at which time the sutures will be removed and the Cam [place]ford Rile will only be utilized for sleep.

## 2015-09-15 ENCOUNTER — Ambulatory Visit (INDEPENDENT_AMBULATORY_CARE_PROVIDER_SITE_OTHER): Payer: PRIVATE HEALTH INSURANCE | Admitting: Podiatry

## 2015-09-15 ENCOUNTER — Encounter: Payer: Self-pay | Admitting: Podiatry

## 2015-09-15 DIAGNOSIS — M722 Plantar fascial fibromatosis: Secondary | ICD-10-CM

## 2015-09-15 DIAGNOSIS — Z9889 Other specified postprocedural states: Secondary | ICD-10-CM | POA: Diagnosis not present

## 2015-09-17 NOTE — Progress Notes (Signed)
She presents today 2 weeks status post endoscopic plantar fasciotomy. She states this seems to be doing much better.  Objective: Vital signs are stable she is alert and oriented 3 there is no erythema edema saline stretch odor no calf pain pulses remain palpable.  Assessment: Endoscopic plantar fasciotomy 2 weeks area  Plan: Sutures were removed today margins remain well coapted Band-Aids were placed. I encouraged patient to get back and retention issues with her orthotics. However she still has to wear a night splint or her CAM boot for the next month at night. I will follow up with her in 2 weeks. Also placed her in a compression anklet for swelling to be worn during the daytime only.

## 2015-09-29 ENCOUNTER — Encounter: Payer: PRIVATE HEALTH INSURANCE | Admitting: Podiatry

## 2015-10-06 ENCOUNTER — Encounter: Payer: Self-pay | Admitting: Podiatry

## 2015-10-06 ENCOUNTER — Ambulatory Visit (INDEPENDENT_AMBULATORY_CARE_PROVIDER_SITE_OTHER): Payer: PRIVATE HEALTH INSURANCE | Admitting: Podiatry

## 2015-10-06 VITALS — BP 132/77 | HR 86 | Resp 12

## 2015-10-06 DIAGNOSIS — M722 Plantar fascial fibromatosis: Secondary | ICD-10-CM

## 2015-10-06 DIAGNOSIS — Z9889 Other specified postprocedural states: Secondary | ICD-10-CM

## 2015-10-06 NOTE — Progress Notes (Signed)
She presents today one month status post endoscopic plantar fasciotomy left foot. She states it is doing much better now.  Objective: Vital signs stable alert and oriented 3. Pulses are palpable. Neurologic sensorium is intact. She has some tenderness on palpation along the medial longitudinal arch. Minimal edema no cellulitis drainage or odor.  Assessment: Well-healing surgical foot left.  Plan: Follow up with me in 1 month. I encouraged her to continue to sleep with it nicely for at least another 2 weeks and wear nothing but tissues and I encouraged her to not engage in any high-impact activities.

## 2015-11-08 ENCOUNTER — Encounter: Payer: Self-pay | Admitting: Podiatry

## 2015-11-08 ENCOUNTER — Ambulatory Visit (INDEPENDENT_AMBULATORY_CARE_PROVIDER_SITE_OTHER): Payer: PRIVATE HEALTH INSURANCE | Admitting: Podiatry

## 2015-11-08 VITALS — BP 146/82 | HR 89 | Resp 12

## 2015-11-08 DIAGNOSIS — Z9889 Other specified postprocedural states: Secondary | ICD-10-CM

## 2015-11-08 NOTE — Progress Notes (Signed)
She presents today for follow-up of her endoscopic plantar fasciotomy of her left foot. She states that she is doing much better there is very little discomfort at this time. Date of surgery was 09/02/2015.  Objective: Vital signs are stable she's alert and oriented 3 no pain on palpation medial calcaneal tubercle.  Assessment: Resolving resolving pain status post endoscopic plantar fasciotomy left.  Plan: Slowly get back to her routine over the next couple of months and I will follow-up with her on an as-needed basis.

## 2015-11-10 ENCOUNTER — Other Ambulatory Visit: Payer: Self-pay | Admitting: Family Medicine

## 2015-11-10 DIAGNOSIS — Z1231 Encounter for screening mammogram for malignant neoplasm of breast: Secondary | ICD-10-CM

## 2015-11-24 NOTE — Progress Notes (Signed)
DOS 09/02/2015 Endoscopic plantar fasciotomy left

## 2015-12-02 ENCOUNTER — Ambulatory Visit: Payer: PRIVATE HEALTH INSURANCE

## 2015-12-07 ENCOUNTER — Ambulatory Visit
Admission: RE | Admit: 2015-12-07 | Discharge: 2015-12-07 | Disposition: A | Discharge status: Home / Self Care | Payer: PRIVATE HEALTH INSURANCE | Source: Ambulatory Visit | Attending: Family Medicine | Admitting: Family Medicine

## 2015-12-07 DIAGNOSIS — Z1231 Encounter for screening mammogram for malignant neoplasm of breast: Secondary | ICD-10-CM

## 2016-02-28 ENCOUNTER — Ambulatory Visit: Payer: PRIVATE HEALTH INSURANCE

## 2016-03-08 ENCOUNTER — Ambulatory Visit: Payer: PRIVATE HEALTH INSURANCE

## 2016-10-26 ENCOUNTER — Other Ambulatory Visit: Payer: Self-pay | Admitting: Family Medicine

## 2016-10-26 DIAGNOSIS — Z1231 Encounter for screening mammogram for malignant neoplasm of breast: Secondary | ICD-10-CM

## 2016-12-10 ENCOUNTER — Ambulatory Visit
Admission: RE | Admit: 2016-12-10 | Discharge: 2016-12-10 | Disposition: A | Discharge status: Home / Self Care | Payer: PRIVATE HEALTH INSURANCE | Source: Ambulatory Visit | Attending: Family Medicine | Admitting: Family Medicine

## 2016-12-10 DIAGNOSIS — Z1231 Encounter for screening mammogram for malignant neoplasm of breast: Secondary | ICD-10-CM

## 2017-09-30 DIAGNOSIS — M7541 Impingement syndrome of right shoulder: Secondary | ICD-10-CM | POA: Insufficient documentation

## 2017-12-11 ENCOUNTER — Other Ambulatory Visit: Payer: Self-pay | Admitting: Family Medicine

## 2017-12-11 DIAGNOSIS — Z1231 Encounter for screening mammogram for malignant neoplasm of breast: Secondary | ICD-10-CM

## 2018-01-08 ENCOUNTER — Ambulatory Visit
Admission: RE | Admit: 2018-01-08 | Discharge: 2018-01-08 | Disposition: A | Discharge status: Home / Self Care | Payer: PRIVATE HEALTH INSURANCE | Source: Ambulatory Visit | Attending: Family Medicine | Admitting: Family Medicine

## 2018-01-08 DIAGNOSIS — Z1231 Encounter for screening mammogram for malignant neoplasm of breast: Secondary | ICD-10-CM

## 2018-08-07 ENCOUNTER — Ambulatory Visit (INDEPENDENT_AMBULATORY_CARE_PROVIDER_SITE_OTHER): Payer: PRIVATE HEALTH INSURANCE | Admitting: Podiatry

## 2018-08-07 ENCOUNTER — Other Ambulatory Visit: Payer: Self-pay

## 2018-08-07 ENCOUNTER — Encounter: Payer: Self-pay | Admitting: Podiatry

## 2018-08-07 ENCOUNTER — Ambulatory Visit (INDEPENDENT_AMBULATORY_CARE_PROVIDER_SITE_OTHER): Payer: PRIVATE HEALTH INSURANCE

## 2018-08-07 ENCOUNTER — Other Ambulatory Visit: Payer: Self-pay | Admitting: Podiatry

## 2018-08-07 VITALS — Temp 97.3°F

## 2018-08-07 DIAGNOSIS — S92504A Nondisplaced unspecified fracture of right lesser toe(s), initial encounter for closed fracture: Secondary | ICD-10-CM

## 2018-08-07 DIAGNOSIS — S90129A Contusion of unspecified lesser toe(s) without damage to nail, initial encounter: Secondary | ICD-10-CM

## 2018-08-09 ENCOUNTER — Encounter: Payer: Self-pay | Admitting: Podiatry

## 2018-08-09 NOTE — Progress Notes (Signed)
She presents today chief complaint of painful toes right foot states that she stubbed it about 4 weeks ago she had some swelling initially and some bruising that was very tender and still unable to walk with any type of compression on the toes.  ROS: Denies fever chills nausea vomiting muscle aches pains calf pain back pain chest pain shortness of breath.  PMH: I have reviewed her past medical history medications allergies surgery social history review of systems.  Objective: Vital signs are stable alert and oriented x3.  Pulses are palpable.  Neurologic sensorium is intact.  DP reflexes are intact.  Muscle strength is normal symmetrical bilateral.  Orthopedic evaluation inserts all joints distal ankle full range of motion with no crepitation she does have pain on palpation of the proximal phalanx fifth digit right.  Radiographs taken today demonstrate an osseously mature individual with what appears to be a telescoping fracture of the fifth proximal phalanx from distal to proximal.  Is slightly dorsally dislocated bone callus is already started form.  Appears to be stable at this point.  Assessment: Fracture fifth digit right.  Healing.  Plan: Discussed etiology pathology conservative therapies at this point demonstrated how to wrap the toe on a regular basis with Covan and then dispensed a Darco shoe.

## 2018-12-10 ENCOUNTER — Other Ambulatory Visit: Payer: Self-pay | Admitting: Family Medicine

## 2018-12-10 DIAGNOSIS — Z1231 Encounter for screening mammogram for malignant neoplasm of breast: Secondary | ICD-10-CM

## 2019-01-21 ENCOUNTER — Ambulatory Visit
Admission: RE | Admit: 2019-01-21 | Discharge: 2019-01-21 | Disposition: A | Discharge status: Home / Self Care | Payer: PRIVATE HEALTH INSURANCE | Source: Ambulatory Visit | Attending: Family Medicine | Admitting: Family Medicine

## 2019-01-21 ENCOUNTER — Other Ambulatory Visit: Payer: Self-pay

## 2019-01-21 DIAGNOSIS — Z1231 Encounter for screening mammogram for malignant neoplasm of breast: Secondary | ICD-10-CM

## 2019-12-23 ENCOUNTER — Other Ambulatory Visit: Payer: Self-pay | Admitting: Family Medicine

## 2019-12-23 DIAGNOSIS — Z1231 Encounter for screening mammogram for malignant neoplasm of breast: Secondary | ICD-10-CM

## 2020-02-01 ENCOUNTER — Other Ambulatory Visit: Payer: Self-pay

## 2020-02-01 ENCOUNTER — Ambulatory Visit
Admission: RE | Admit: 2020-02-01 | Discharge: 2020-02-01 | Disposition: A | Discharge status: Home / Self Care | Payer: PRIVATE HEALTH INSURANCE | Source: Ambulatory Visit | Attending: Family Medicine | Admitting: Family Medicine

## 2020-02-01 DIAGNOSIS — Z1231 Encounter for screening mammogram for malignant neoplasm of breast: Secondary | ICD-10-CM

## 2020-07-12 DIAGNOSIS — M25512 Pain in left shoulder: Secondary | ICD-10-CM | POA: Diagnosis not present

## 2020-07-12 DIAGNOSIS — M542 Cervicalgia: Secondary | ICD-10-CM | POA: Diagnosis not present

## 2020-07-27 DIAGNOSIS — B354 Tinea corporis: Secondary | ICD-10-CM | POA: Diagnosis not present

## 2020-07-27 DIAGNOSIS — Z6841 Body Mass Index (BMI) 40.0 and over, adult: Secondary | ICD-10-CM | POA: Diagnosis not present

## 2020-07-27 DIAGNOSIS — E7800 Pure hypercholesterolemia, unspecified: Secondary | ICD-10-CM | POA: Diagnosis not present

## 2020-07-27 DIAGNOSIS — G2581 Restless legs syndrome: Secondary | ICD-10-CM | POA: Diagnosis not present

## 2020-07-27 DIAGNOSIS — Z0000 Encounter for general adult medical examination without abnormal findings: Secondary | ICD-10-CM | POA: Diagnosis not present

## 2020-07-27 DIAGNOSIS — R69 Illness, unspecified: Secondary | ICD-10-CM | POA: Diagnosis not present

## 2020-07-27 DIAGNOSIS — Z23 Encounter for immunization: Secondary | ICD-10-CM | POA: Diagnosis not present

## 2020-11-20 DIAGNOSIS — W1830XA Fall on same level, unspecified, initial encounter: Secondary | ICD-10-CM | POA: Diagnosis not present

## 2020-11-20 DIAGNOSIS — S92902A Unspecified fracture of left foot, initial encounter for closed fracture: Secondary | ICD-10-CM | POA: Diagnosis not present

## 2020-11-20 DIAGNOSIS — S92352A Displaced fracture of fifth metatarsal bone, left foot, initial encounter for closed fracture: Secondary | ICD-10-CM | POA: Diagnosis not present

## 2020-11-20 DIAGNOSIS — S93402A Sprain of unspecified ligament of left ankle, initial encounter: Secondary | ICD-10-CM | POA: Diagnosis not present

## 2020-11-20 DIAGNOSIS — M25572 Pain in left ankle and joints of left foot: Secondary | ICD-10-CM | POA: Diagnosis not present

## 2020-11-20 DIAGNOSIS — M79672 Pain in left foot: Secondary | ICD-10-CM | POA: Diagnosis not present

## 2020-11-20 DIAGNOSIS — S93492A Sprain of other ligament of left ankle, initial encounter: Secondary | ICD-10-CM | POA: Diagnosis not present

## 2020-11-21 DIAGNOSIS — M79672 Pain in left foot: Secondary | ICD-10-CM | POA: Diagnosis not present

## 2020-11-21 DIAGNOSIS — M25572 Pain in left ankle and joints of left foot: Secondary | ICD-10-CM | POA: Diagnosis not present

## 2020-11-21 DIAGNOSIS — S93492A Sprain of other ligament of left ankle, initial encounter: Secondary | ICD-10-CM | POA: Diagnosis not present

## 2020-11-21 DIAGNOSIS — S93402A Sprain of unspecified ligament of left ankle, initial encounter: Secondary | ICD-10-CM | POA: Diagnosis not present

## 2020-11-21 DIAGNOSIS — S92352A Displaced fracture of fifth metatarsal bone, left foot, initial encounter for closed fracture: Secondary | ICD-10-CM | POA: Diagnosis not present

## 2020-11-23 DIAGNOSIS — S92352A Displaced fracture of fifth metatarsal bone, left foot, initial encounter for closed fracture: Secondary | ICD-10-CM | POA: Diagnosis not present

## 2020-11-25 DIAGNOSIS — S92352D Displaced fracture of fifth metatarsal bone, left foot, subsequent encounter for fracture with routine healing: Secondary | ICD-10-CM | POA: Diagnosis not present

## 2020-11-28 DIAGNOSIS — M25572 Pain in left ankle and joints of left foot: Secondary | ICD-10-CM | POA: Diagnosis not present

## 2020-11-28 DIAGNOSIS — S92352A Displaced fracture of fifth metatarsal bone, left foot, initial encounter for closed fracture: Secondary | ICD-10-CM | POA: Diagnosis not present

## 2020-11-28 DIAGNOSIS — M79672 Pain in left foot: Secondary | ICD-10-CM | POA: Diagnosis not present

## 2020-11-29 DIAGNOSIS — S92352A Displaced fracture of fifth metatarsal bone, left foot, initial encounter for closed fracture: Secondary | ICD-10-CM | POA: Diagnosis not present

## 2020-11-29 DIAGNOSIS — Y999 Unspecified external cause status: Secondary | ICD-10-CM | POA: Diagnosis not present

## 2020-11-29 DIAGNOSIS — X58XXXA Exposure to other specified factors, initial encounter: Secondary | ICD-10-CM | POA: Diagnosis not present

## 2020-11-29 DIAGNOSIS — G8918 Other acute postprocedural pain: Secondary | ICD-10-CM | POA: Diagnosis not present

## 2020-12-27 DIAGNOSIS — F339 Major depressive disorder, recurrent, unspecified: Secondary | ICD-10-CM | POA: Diagnosis not present

## 2020-12-27 DIAGNOSIS — R69 Illness, unspecified: Secondary | ICD-10-CM | POA: Diagnosis not present

## 2020-12-27 DIAGNOSIS — F5104 Psychophysiologic insomnia: Secondary | ICD-10-CM | POA: Diagnosis not present

## 2021-01-04 ENCOUNTER — Other Ambulatory Visit: Payer: Self-pay | Admitting: Family Medicine

## 2021-01-04 DIAGNOSIS — Z1231 Encounter for screening mammogram for malignant neoplasm of breast: Secondary | ICD-10-CM

## 2021-01-12 DIAGNOSIS — M79671 Pain in right foot: Secondary | ICD-10-CM | POA: Diagnosis not present

## 2021-01-12 DIAGNOSIS — Z4889 Encounter for other specified surgical aftercare: Secondary | ICD-10-CM | POA: Diagnosis not present

## 2021-01-12 DIAGNOSIS — S92352A Displaced fracture of fifth metatarsal bone, left foot, initial encounter for closed fracture: Secondary | ICD-10-CM | POA: Diagnosis not present

## 2021-02-01 ENCOUNTER — Ambulatory Visit
Admission: RE | Admit: 2021-02-01 | Discharge: 2021-02-01 | Disposition: A | Discharge status: Home / Self Care | Payer: Medicare HMO | Source: Ambulatory Visit | Attending: Family Medicine | Admitting: Family Medicine

## 2021-02-01 DIAGNOSIS — Z1231 Encounter for screening mammogram for malignant neoplasm of breast: Secondary | ICD-10-CM | POA: Diagnosis not present

## 2021-02-15 DIAGNOSIS — S92352A Displaced fracture of fifth metatarsal bone, left foot, initial encounter for closed fracture: Secondary | ICD-10-CM | POA: Diagnosis not present

## 2021-02-15 DIAGNOSIS — Z4889 Encounter for other specified surgical aftercare: Secondary | ICD-10-CM | POA: Diagnosis not present

## 2021-02-22 DIAGNOSIS — F5104 Psychophysiologic insomnia: Secondary | ICD-10-CM | POA: Diagnosis not present

## 2021-02-22 DIAGNOSIS — G2581 Restless legs syndrome: Secondary | ICD-10-CM | POA: Diagnosis not present

## 2021-02-22 DIAGNOSIS — R69 Illness, unspecified: Secondary | ICD-10-CM | POA: Diagnosis not present

## 2021-02-22 DIAGNOSIS — E7800 Pure hypercholesterolemia, unspecified: Secondary | ICD-10-CM | POA: Diagnosis not present

## 2021-03-13 DIAGNOSIS — F411 Generalized anxiety disorder: Secondary | ICD-10-CM | POA: Diagnosis not present

## 2021-03-13 DIAGNOSIS — F339 Major depressive disorder, recurrent, unspecified: Secondary | ICD-10-CM | POA: Diagnosis not present

## 2021-03-13 DIAGNOSIS — R69 Illness, unspecified: Secondary | ICD-10-CM | POA: Diagnosis not present

## 2021-03-21 DIAGNOSIS — R69 Illness, unspecified: Secondary | ICD-10-CM | POA: Diagnosis not present

## 2021-03-21 DIAGNOSIS — F411 Generalized anxiety disorder: Secondary | ICD-10-CM | POA: Diagnosis not present

## 2021-03-21 DIAGNOSIS — F339 Major depressive disorder, recurrent, unspecified: Secondary | ICD-10-CM | POA: Diagnosis not present

## 2021-04-21 DIAGNOSIS — F339 Major depressive disorder, recurrent, unspecified: Secondary | ICD-10-CM | POA: Diagnosis not present

## 2021-04-21 DIAGNOSIS — F411 Generalized anxiety disorder: Secondary | ICD-10-CM | POA: Diagnosis not present

## 2021-04-26 DIAGNOSIS — T849XXA Unspecified complication of internal orthopedic prosthetic device, implant and graft, initial encounter: Secondary | ICD-10-CM | POA: Diagnosis not present

## 2021-04-26 DIAGNOSIS — S92352A Displaced fracture of fifth metatarsal bone, left foot, initial encounter for closed fracture: Secondary | ICD-10-CM | POA: Diagnosis not present

## 2021-05-18 DIAGNOSIS — F339 Major depressive disorder, recurrent, unspecified: Secondary | ICD-10-CM | POA: Diagnosis not present

## 2021-05-18 DIAGNOSIS — F411 Generalized anxiety disorder: Secondary | ICD-10-CM | POA: Diagnosis not present

## 2021-06-14 ENCOUNTER — Other Ambulatory Visit: Payer: Self-pay | Admitting: Student

## 2021-06-14 DIAGNOSIS — M25572 Pain in left ankle and joints of left foot: Secondary | ICD-10-CM | POA: Diagnosis not present

## 2021-06-14 DIAGNOSIS — S92352A Displaced fracture of fifth metatarsal bone, left foot, initial encounter for closed fracture: Secondary | ICD-10-CM | POA: Diagnosis not present

## 2021-06-14 DIAGNOSIS — T849XXA Unspecified complication of internal orthopedic prosthetic device, implant and graft, initial encounter: Secondary | ICD-10-CM | POA: Diagnosis not present

## 2021-06-14 DIAGNOSIS — M79672 Pain in left foot: Secondary | ICD-10-CM

## 2021-06-15 DIAGNOSIS — F339 Major depressive disorder, recurrent, unspecified: Secondary | ICD-10-CM | POA: Diagnosis not present

## 2021-06-15 DIAGNOSIS — F411 Generalized anxiety disorder: Secondary | ICD-10-CM | POA: Diagnosis not present

## 2021-06-28 ENCOUNTER — Ambulatory Visit
Admission: RE | Admit: 2021-06-28 | Discharge: 2021-06-28 | Disposition: A | Discharge status: Home / Self Care | Payer: Medicare HMO | Source: Ambulatory Visit | Attending: Student | Admitting: Student

## 2021-06-28 DIAGNOSIS — M7732 Calcaneal spur, left foot: Secondary | ICD-10-CM | POA: Diagnosis not present

## 2021-06-28 DIAGNOSIS — M79672 Pain in left foot: Secondary | ICD-10-CM

## 2021-07-03 DIAGNOSIS — G5782 Other specified mononeuropathies of left lower limb: Secondary | ICD-10-CM | POA: Diagnosis not present

## 2021-07-03 DIAGNOSIS — M65872 Other synovitis and tenosynovitis, left ankle and foot: Secondary | ICD-10-CM | POA: Diagnosis not present

## 2021-07-03 DIAGNOSIS — T8484XA Pain due to internal orthopedic prosthetic devices, implants and grafts, initial encounter: Secondary | ICD-10-CM | POA: Diagnosis not present

## 2021-07-11 ENCOUNTER — Other Ambulatory Visit: Payer: Self-pay | Admitting: Orthopedic Surgery

## 2021-07-11 DIAGNOSIS — T8484XA Pain due to internal orthopedic prosthetic devices, implants and grafts, initial encounter: Secondary | ICD-10-CM | POA: Diagnosis not present

## 2021-07-11 DIAGNOSIS — M19072 Primary osteoarthritis, left ankle and foot: Secondary | ICD-10-CM | POA: Diagnosis not present

## 2021-07-11 DIAGNOSIS — D492 Neoplasm of unspecified behavior of bone, soft tissue, and skin: Secondary | ICD-10-CM | POA: Diagnosis not present

## 2021-07-11 DIAGNOSIS — R2242 Localized swelling, mass and lump, left lower limb: Secondary | ICD-10-CM | POA: Diagnosis not present

## 2021-07-11 DIAGNOSIS — D2122 Benign neoplasm of connective and other soft tissue of left lower limb, including hip: Secondary | ICD-10-CM | POA: Diagnosis not present

## 2021-07-13 DIAGNOSIS — F339 Major depressive disorder, recurrent, unspecified: Secondary | ICD-10-CM | POA: Diagnosis not present

## 2021-07-13 DIAGNOSIS — F411 Generalized anxiety disorder: Secondary | ICD-10-CM | POA: Diagnosis not present

## 2021-07-25 DIAGNOSIS — E7800 Pure hypercholesterolemia, unspecified: Secondary | ICD-10-CM | POA: Diagnosis not present

## 2021-07-25 DIAGNOSIS — Z6841 Body Mass Index (BMI) 40.0 and over, adult: Secondary | ICD-10-CM | POA: Diagnosis not present

## 2021-07-25 DIAGNOSIS — N951 Menopausal and female climacteric states: Secondary | ICD-10-CM | POA: Diagnosis not present

## 2021-07-25 DIAGNOSIS — E039 Hypothyroidism, unspecified: Secondary | ICD-10-CM | POA: Diagnosis not present

## 2021-07-25 DIAGNOSIS — R635 Abnormal weight gain: Secondary | ICD-10-CM | POA: Diagnosis not present

## 2021-07-25 DIAGNOSIS — E559 Vitamin D deficiency, unspecified: Secondary | ICD-10-CM | POA: Diagnosis not present

## 2021-08-01 DIAGNOSIS — H903 Sensorineural hearing loss, bilateral: Secondary | ICD-10-CM | POA: Diagnosis not present

## 2021-08-01 DIAGNOSIS — H6123 Impacted cerumen, bilateral: Secondary | ICD-10-CM | POA: Diagnosis not present

## 2021-08-01 DIAGNOSIS — J3489 Other specified disorders of nose and nasal sinuses: Secondary | ICD-10-CM | POA: Diagnosis not present

## 2021-08-02 DIAGNOSIS — E7800 Pure hypercholesterolemia, unspecified: Secondary | ICD-10-CM | POA: Diagnosis not present

## 2021-08-02 DIAGNOSIS — E559 Vitamin D deficiency, unspecified: Secondary | ICD-10-CM | POA: Diagnosis not present

## 2021-08-02 DIAGNOSIS — R635 Abnormal weight gain: Secondary | ICD-10-CM | POA: Diagnosis not present

## 2021-08-02 DIAGNOSIS — F418 Other specified anxiety disorders: Secondary | ICD-10-CM | POA: Diagnosis not present

## 2021-08-02 DIAGNOSIS — Z1331 Encounter for screening for depression: Secondary | ICD-10-CM | POA: Diagnosis not present

## 2021-08-02 DIAGNOSIS — Z6841 Body Mass Index (BMI) 40.0 and over, adult: Secondary | ICD-10-CM | POA: Diagnosis not present

## 2021-08-02 DIAGNOSIS — E039 Hypothyroidism, unspecified: Secondary | ICD-10-CM | POA: Diagnosis not present

## 2021-08-02 DIAGNOSIS — Z1339 Encounter for screening examination for other mental health and behavioral disorders: Secondary | ICD-10-CM | POA: Diagnosis not present

## 2021-08-04 DIAGNOSIS — F411 Generalized anxiety disorder: Secondary | ICD-10-CM | POA: Diagnosis not present

## 2021-08-04 DIAGNOSIS — F339 Major depressive disorder, recurrent, unspecified: Secondary | ICD-10-CM | POA: Diagnosis not present

## 2021-08-09 DIAGNOSIS — F5104 Psychophysiologic insomnia: Secondary | ICD-10-CM | POA: Diagnosis not present

## 2021-08-09 DIAGNOSIS — Z0000 Encounter for general adult medical examination without abnormal findings: Secondary | ICD-10-CM | POA: Diagnosis not present

## 2021-08-09 DIAGNOSIS — D699 Hemorrhagic condition, unspecified: Secondary | ICD-10-CM | POA: Diagnosis not present

## 2021-08-09 DIAGNOSIS — E7800 Pure hypercholesterolemia, unspecified: Secondary | ICD-10-CM | POA: Diagnosis not present

## 2021-08-09 DIAGNOSIS — G2581 Restless legs syndrome: Secondary | ICD-10-CM | POA: Diagnosis not present

## 2021-08-09 DIAGNOSIS — R233 Spontaneous ecchymoses: Secondary | ICD-10-CM | POA: Diagnosis not present

## 2021-08-09 DIAGNOSIS — F339 Major depressive disorder, recurrent, unspecified: Secondary | ICD-10-CM | POA: Diagnosis not present

## 2021-08-18 DIAGNOSIS — H52223 Regular astigmatism, bilateral: Secondary | ICD-10-CM | POA: Diagnosis not present

## 2021-08-18 DIAGNOSIS — H524 Presbyopia: Secondary | ICD-10-CM | POA: Diagnosis not present

## 2021-08-18 DIAGNOSIS — H5203 Hypermetropia, bilateral: Secondary | ICD-10-CM | POA: Diagnosis not present

## 2021-08-18 DIAGNOSIS — H353131 Nonexudative age-related macular degeneration, bilateral, early dry stage: Secondary | ICD-10-CM | POA: Diagnosis not present

## 2021-08-18 DIAGNOSIS — H25011 Cortical age-related cataract, right eye: Secondary | ICD-10-CM | POA: Diagnosis not present

## 2021-08-29 DIAGNOSIS — F411 Generalized anxiety disorder: Secondary | ICD-10-CM | POA: Diagnosis not present

## 2021-08-29 DIAGNOSIS — F339 Major depressive disorder, recurrent, unspecified: Secondary | ICD-10-CM | POA: Diagnosis not present

## 2021-09-15 DIAGNOSIS — F411 Generalized anxiety disorder: Secondary | ICD-10-CM | POA: Diagnosis not present

## 2021-09-15 DIAGNOSIS — F339 Major depressive disorder, recurrent, unspecified: Secondary | ICD-10-CM | POA: Diagnosis not present

## 2021-09-29 DIAGNOSIS — F322 Major depressive disorder, single episode, severe without psychotic features: Secondary | ICD-10-CM | POA: Diagnosis not present

## 2021-10-19 DIAGNOSIS — F411 Generalized anxiety disorder: Secondary | ICD-10-CM | POA: Diagnosis not present

## 2021-10-19 DIAGNOSIS — F322 Major depressive disorder, single episode, severe without psychotic features: Secondary | ICD-10-CM | POA: Diagnosis not present

## 2021-11-02 DIAGNOSIS — F5101 Primary insomnia: Secondary | ICD-10-CM | POA: Diagnosis not present

## 2021-11-02 DIAGNOSIS — F322 Major depressive disorder, single episode, severe without psychotic features: Secondary | ICD-10-CM | POA: Diagnosis not present

## 2021-11-13 DIAGNOSIS — F5101 Primary insomnia: Secondary | ICD-10-CM | POA: Diagnosis not present

## 2021-11-13 DIAGNOSIS — F322 Major depressive disorder, single episode, severe without psychotic features: Secondary | ICD-10-CM | POA: Diagnosis not present

## 2021-12-01 ENCOUNTER — Ambulatory Visit (INDEPENDENT_AMBULATORY_CARE_PROVIDER_SITE_OTHER): Payer: Medicare HMO | Admitting: Psychology

## 2021-12-01 ENCOUNTER — Ambulatory Visit: Payer: Medicare HMO | Admitting: Psychology

## 2021-12-01 DIAGNOSIS — F331 Major depressive disorder, recurrent, moderate: Secondary | ICD-10-CM

## 2021-12-01 DIAGNOSIS — F4321 Adjustment disorder with depressed mood: Secondary | ICD-10-CM

## 2021-12-01 NOTE — Progress Notes (Signed)
[facility] Counselor Initial Adult Exam  Name: Cindy Combs Date: 12/01/2021 MRN: [id number] DOB: 09/10/1955 PCP: Cindy Nip, MD  Time Spent: 10:35  am - 11:30 am: 55 Minutes   Cindy Combs participated from home, via video, and consented to treatment. Therapist participated from home office. We met online due to [place] pandemic.   Guardian/Payee:  Cindy Combs    Paperwork requested: No   Reason for Visit /Presenting Problem: Depression, unresolved grief  Mental Status Exam: Appearance:   Casual     Behavior:  Appropriate  Motor:  Normal  Speech/Language:   Normal Rate  Affect:  Depressed  Mood:  depressed  Thought process:  normal  Thought content:    WNL  Sensory/Perceptual disturbances:    WNL  Orientation:  oriented to person, place, and situation  Attention:  Good  Concentration:  Fair  Memory:  [place] of knowledge:   Good  Insight:    Good  Judgment:   Good  Impulse Control:  Good    Reported Symptoms:  sadness, poor concentration, tearful, angry  Risk Assessment: Danger to Self:  No Self-injurious Behavior: No Danger to Others: No Duty to Warn:no Physical Aggression / Violence:No  Access to Firearms a concern:  unknown Gang Involvement:No  Patient / guardian was educated about steps to take if suicide or homicide risk level increases between visits: Cindy Combs While future psychiatric events cannot be accurately predicted, the patient does not currently require acute inpatient psychiatric care and does not currently meet [facility] involuntary commitment criteria.  Substance Abuse History: Current substance abuse: No     Past Psychiatric History:   Previous psychological history is significant for depression Outpatient Providers:Hospice grief counselors History of Psych Hospitalization: No  Psychological Testing:  Cindy Combs    Abuse History:  Victim of: No.,  Cindy Combs    Report needed: No. Victim of  Neglect:No. Perpetrator of  Cindy Combs   Witness / Exposure to Domestic Violence: No   Protective Services Involvement: No  Witness to [employer] Violence:  No   Family History:  Family History  Problem Relation Age of Onset   Hypertension Mother    Arthritis Mother    Hypertension Father    Cancer Sister        breast cancer   Arthritis Sister    Breast cancer Sister     Living situation: the patient lives with their spouse  Sexual Orientation: Straight  Relationship Status: married  Name of spouse / other:unknown If a parent, number of children / ages:two daughters, 1 son (deceased).  Support Systems: spouse  Museum/gallery curator Stress:  No   Income/Employment/Disability: Actor: No   Educational History: Education:  unknown  Religion/Sprituality/World View: Catholic  Any cultural differences that may affect / interfere with treatment:  not applicable   Recreation/Hobbies: unknown  Stressors: Traumatic event    Strengths: Family  Barriers:  unknown   Legal History: Pending legal issue / charges: The patient has no significant history of legal issues. History of legal issue / charges:  Cindy Combs  Medical History/Surgical History: reviewed Past Medical History:  Diagnosis Date   Allergy    Anxiety    Hyperlipidemia    Migraine    Restless leg syndrome     Past Surgical History:  Procedure Laterality Date   ABDOMINAL HYSTERECTOMY      Medications: Current  Outpatient Medications  Medication Sig Dispense Refill   atorvastatin (LIPITOR) 20 MG tablet Take 20 mg by mouth daily.     rOPINIRole (REQUIP) 1 MG tablet Take 1 mg by mouth 3 (three) times daily.     sertraline (ZOLOFT) 50 MG tablet Take 50 mg by mouth daily.     traZODone (DESYREL) 50 MG tablet Take 50 mg by mouth at bedtime.     No current facility-administered medications for this visit.    No Known Allergies Initial note: Patient states that her son committed  suicide last year on August 24th. He was 61, married with 2 children. He had depression. Cindy Combs also suffers depression.  Her daughter lives in [place] and Cindy Combs was there to be with daughter who was about to deliver her first child. First day there, the 22nd, she fell down stairs and broke foot. Grandchild delivered next day and son committed suicide the 24th. She and husband stayed in [place] for 3 days after. The orthopedist saw her right away and she had surgery.   Her son Cindy Combs called Cindy Combs the night the baby was born which was day before he committed suicide. He told her that things were not good and it was his fault. He planned to go to psychiatrist the next day. He committed suicide the next morning at 5:30am. 6 years earlier he was suicidal and they took him to a hospital and got him committed (in [place]). She also has another daughter in [place] and the daughter that was in [place] is now in [place]. She says they do not have a great relationship with daughter in law so they do not see the kids as often as they would like. The daughter is now engaged and it is "difficult" because it is so quick. Cindy Combs' kids are 18 and 6.  She states this has been a "horrible" year. Struggled to get out of bed and husband kept cheering her on. She saw a grief counselor and says it is a roller coaster of emotions all year. She keeps trying to remind herself that he was in so much pain and needed to be at peace. He was very close with the entire family. He was the oldest of her children. 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 Very bright and a "big smile". His depression was most pronounced after college. She saw 2 different grief counselors and hasnt been there the past month. She has tried to get appointment for a long time. She feels that her husband does not show emotion and it sometimes makes her feel that he is not grieving. Both retired last year. She worked for [employer]  and was at [employer] for 36 years before that. She retired the 08/07/22 before Cindy Combs' death. Her husband retired the December after she retired. She baby sits "a lot". She and husband took a cruise to [place] and they enjoy travel. She has trouble reading due to poor concentration.  Her depression dates back to her early 17's. She was treated with therapy but not medication. In therapy off and on for may years. Never any long term relationships with therapists. Currently takes Trazadone to sleep (for years) and Desvenlafaxine (173m.) which she has taken since start of August. Brand name Pristiq.  Aripiprazole (Abilify)was recently added. Psychiatrist is [facility]. She has been treated there for 2 months. Previously on Zoloft for a long time. Prior to CGerald Combs death her depression was being managed  well with medication. She struggled in that she had "no part" in [place]' funeral. Cindy Combs belongs to [place] and is observant. Says she has lately been mad at G-[employer] and hasn't been going to church. She has talked to her priest.     Goals: Patient is seeking counseling to reduce symptoms of depression that have been triggered by the traumatic experience of her son's suicide. Further, is seeking strategies to manage her unresolved grief/anger issues. Will utilize insight oriented therapy and family systems approaches to stabalize moods and reduce symptoms. Goal date is 8-24. Diagnoses:  Major depression, grief, possible PTSD  Plan of Care: outpatient psychotherapy and medication management   Marcelina Morel, PhD

## 2021-12-05 DIAGNOSIS — F322 Major depressive disorder, single episode, severe without psychotic features: Secondary | ICD-10-CM | POA: Diagnosis not present

## 2021-12-05 DIAGNOSIS — F5101 Primary insomnia: Secondary | ICD-10-CM | POA: Diagnosis not present

## 2021-12-15 ENCOUNTER — Ambulatory Visit: Payer: Medicare HMO | Admitting: Psychology

## 2021-12-19 DIAGNOSIS — M542 Cervicalgia: Secondary | ICD-10-CM | POA: Diagnosis not present

## 2021-12-22 ENCOUNTER — Ambulatory Visit: Payer: Medicare HMO | Admitting: Psychology

## 2021-12-26 DIAGNOSIS — F5101 Primary insomnia: Secondary | ICD-10-CM | POA: Diagnosis not present

## 2021-12-26 DIAGNOSIS — F322 Major depressive disorder, single episode, severe without psychotic features: Secondary | ICD-10-CM | POA: Diagnosis not present

## 2021-12-29 ENCOUNTER — Ambulatory Visit: Payer: Medicare HMO | Admitting: Psychology

## 2022-01-01 ENCOUNTER — Ambulatory Visit (INDEPENDENT_AMBULATORY_CARE_PROVIDER_SITE_OTHER): Payer: Medicare HMO | Admitting: Psychology

## 2022-01-01 ENCOUNTER — Other Ambulatory Visit: Payer: Self-pay | Admitting: Family Medicine

## 2022-01-01 DIAGNOSIS — F331 Major depressive disorder, recurrent, moderate: Secondary | ICD-10-CM

## 2022-01-01 DIAGNOSIS — Z1231 Encounter for screening mammogram for malignant neoplasm of breast: Secondary | ICD-10-CM

## 2022-01-01 NOTE — Progress Notes (Signed)
Cindy Combs  Name: Cindy Combs Date: 01/01/2022 MRN: [id number] DOB: Apr 28, 1955 PCP: Cindy Nip, MD     Cindy Combs participated from home, via video, and consented to treatment. Therapist participated from home office. We met online due to Cindy Combs pandemic.   Guardian/Payee:  N/A    Paperwork requested: No   Reason for Visit /Presenting Problem: Depression, unresolved grief  Mental Status Combs: Appearance:   Casual     Behavior:  Appropriate  Motor:  Normal  Speech/Language:   Normal Rate  Affect:  Depressed  Mood:  depressed  Thought process:  normal  Thought content:    WNL  Sensory/Perceptual disturbances:    WNL  Orientation:  oriented to person, place, and situation  Attention:  Good  Concentration:  Fair  Memory:  Cindy Combs of knowledge:   Good  Insight:    Good  Judgment:   Good  Impulse Control:  Good    Reported Symptoms:  sadness, poor concentration, tearful, angry  Risk Assessment: Danger to Self:  No Self-injurious Behavior: No Danger to Others: No Duty to Warn:no Physical Aggression / Violence:No  Access to Firearms a concern:  unknown Gang Involvement:No  Patient / guardian was educated about steps to take if suicide or homicide risk level increases between visits: n/a While future psychiatric events cannot be accurately predicted, the patient does not currently require acute inpatient psychiatric care and does not currently meet Cindy Combs involuntary commitment criteria.  Substance Abuse History: Current substance abuse: No     Past Psychiatric History:   Previous psychological history is significant for depression Outpatient Providers:Hospice grief counselors History of Psych Hospitalization: No  Psychological Testing:  N/A    Abuse History:  Victim of: No.,  N/A    Report needed: No. Victim of Neglect:No. Perpetrator of  N/A   Witness / Exposure to Domestic Violence:  No   Protective Services Involvement: No  Witness to [employer] Violence:  No   Family History:  Family History  Problem Relation Age of Onset   Hypertension Mother    Arthritis Mother    Hypertension Father    Cancer Sister        breast cancer   Arthritis Sister    Breast cancer Sister     Living situation: the patient lives with their spouse  Sexual Orientation: Straight  Relationship Status: married  Name of spouse / other:unknown If a parent, number of children / ages:two daughters, 1 son (deceased).  Support Systems: spouse  Museum/gallery curator Stress:  No   Income/Employment/Disability: Actor: No   Educational History: Education:  unknown  Religion/Sprituality/World View: Catholic  Any cultural differences that may affect / interfere with treatment:  not applicable   Recreation/Hobbies: unknown  Stressors: Traumatic event    Strengths: Family  Barriers:  unknown   Legal History: Pending legal issue / charges: The patient has no significant history of legal issues. History of legal issue / charges:  N/A  Medical History/Surgical History: reviewed Past Medical History:  Diagnosis Date   Allergy    Anxiety    Hyperlipidemia    Migraine    Restless leg syndrome     Past Surgical History:  Procedure Laterality Date   ABDOMINAL HYSTERECTOMY      Medications: Current Outpatient Medications  Medication Sig Dispense Refill   atorvastatin (LIPITOR) 20 MG tablet Take 20 mg by mouth daily.     rOPINIRole (REQUIP)  1 MG tablet Take 1 mg by mouth 3 (three) times daily.     sertraline (ZOLOFT) 50 MG tablet Take 50 mg by mouth daily.     traZODone (DESYREL) 50 MG tablet Take 50 mg by mouth at bedtime.     No current facility-administered medications for this visit.    No Known Allergies Initial note: Patient states that her son committed suicide last year on August 24th. He was 37, married with 2 children. He had  depression. Cindy Combs also suffers depression.  Her daughter lives in [place] and Cindy Combs was there to be with daughter who was about to deliver her first child. First day there, the 22nd, she fell down stairs and broke foot. Grandchild delivered next day and son committed suicide the 24th. She and husband stayed in [place] for 3 days after. The orthopedist saw her right away and she had surgery.   Her son Cindy Combs called Cindy Combs the night the baby was born which was day before he committed suicide. He told her that things were not good and it was his fault. He planned to go to psychiatrist the next day. He committed suicide the next morning at 5:30am. 6 years earlier he was suicidal and they took him to a hospital and got him committed (in [place]). She also has another daughter in [place] and the daughter that was in [place] is now in [place]. She says they do not have a great relationship with daughter in law so they do not see the kids as often as they would like. The daughter is now engaged and it is "difficult" because it is so quick. Cindy Combs' kids are 53 and 6.  She states this has been a "horrible" year. Struggled to get out of bed and husband kept cheering her on. She saw a grief counselor and says it is a roller coaster of emotions all year. She keeps trying to remind herself that he was in so much pain and needed to be at peace. He was very close with the entire family. He was the oldest of her children. 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 Very bright and a "big smile". His depression was most pronounced after college. She saw 2 different grief counselors and hasnt been there the past month. She has tried to get appointment for a long time. She feels that her husband does not show emotion and it sometimes makes her feel that he is not grieving. Both retired last year. She worked for [employer] and was at [employer] for 36 years before that. She retired the 08/28/2022 before Cindy Combs'  death. Her husband retired the December after she retired. She baby sits "a lot". She and husband took a cruise to [place] and they enjoy travel. She has trouble reading due to poor concentration.  Her depression dates back to her early 85's. She was treated with therapy but not medication. In therapy off and on for may years. Never any long term relationships with therapists. Currently takes Trazadone to sleep (for years) and Desvenlafaxine (176m.) which she has taken since start of August. Brand name Pristiq.  Aripiprazole (Abilify)was recently added. Psychiatrist is [facility]. She has been treated there for 2 months. Previously on Zoloft for a long time. Prior to CGerald Combs death her depression was being managed well with medication. She struggled in that [place]" [place]. [place]. Says she has  lately been mad at G-d and hasn't been going to church. She has talked to her priest.     Goals: Patient is seeking counseling to reduce symptoms of depression that have been triggered by the traumatic experience of her son's suicide. Further, is seeking strategies to manage her unresolved grief/anger issues. Will utilize insight oriented therapy and family systems approaches to stabalize moods and reduce symptoms. Goal date is 8-24.   Diagnoses:  Major depression, grief, possible PTSD  Plan of Care: outpatient psychotherapy and medication management  Session Note: She went to [place] babysitting for a week and is heading back out again. She feels that she is currently at the best she has been since [facility] Eye' death. Still has some down days but they are less frequent and less intense. No longer crying at the "drop of a hat". She has talked with her husband about her feelings and "he will listen".  She is finally starting to put out pictures of Cindy Combs because it was initially too painful. She has been engaged with her crafting and is involved  with her friends in her community. Has a number of interests (reading, baking, etc.). She says she still struggles with the church and hasn't gone back. She got the book by Karie Georges I suggested, but hasn't started to read it yet. She says she is repeating the last 24 hours of her son's life over and over. She thinks she is "tormenting herself with thought of her son on that day. Told her to monitor the frequency and intensity of her trauma response. Discussed EMDR as a possibility if no improvement or getting worse. She will journal episodes of "replaying" the trauma.  She is still struggling with her sleep and does not feel the medicine is helpful. She was a sleepwalker on Ambien and had to stop. Suggested that she try the app CBT-I Coach. Will also explore guided imagery and meditation at next appointment. Also gave her the APP "CALM".         Marcelina Morel, PhD  10:40a-11:30a 50 minutes

## 2022-01-12 ENCOUNTER — Ambulatory Visit: Payer: Medicare HMO | Admitting: Psychology

## 2022-01-24 ENCOUNTER — Ambulatory Visit (INDEPENDENT_AMBULATORY_CARE_PROVIDER_SITE_OTHER): Payer: Medicare HMO | Admitting: Psychology

## 2022-01-24 DIAGNOSIS — F331 Major depressive disorder, recurrent, moderate: Secondary | ICD-10-CM

## 2022-01-24 NOTE — Progress Notes (Signed)
[facility] Initial Adult Exam  Name: Cindy Combs Date: 01/24/2022 MRN: [id number] DOB: 1955-09-21 PCP: Aretta Nip, MD     Cindy Combs participated from home, via video, and consented to treatment. Therapist participated from home office. We met online due to [place] pandemic.   Guardian/Payee:  N/A    Paperwork requested: No   Reason for Visit /Presenting Problem: Depression, unresolved grief  Mental Status Exam: Appearance:   Casual     Behavior:  Appropriate  Motor:  Normal  Speech/Language:   Normal Rate  Affect:  Depressed  Mood:  depressed  Thought process:  normal  Thought content:    WNL  Sensory/Perceptual disturbances:    WNL  Orientation:  oriented to person, place, and situation  Attention:  Good  Concentration:  Fair  Memory:  Cindy Combs of knowledge:   Good  Insight:    Good  Judgment:   Good  Impulse Control:  Good    Reported Symptoms:  sadness, poor concentration, tearful, angry  Risk Assessment: Danger to Self:  No Self-injurious Behavior: No Danger to Others: No Duty to Warn:no Physical Aggression / Violence:No  Access to Firearms a concern:  unknown Gang Involvement:No  Patient / guardian was educated about steps to take if suicide or homicide risk level increases between visits: n/a While future psychiatric events cannot be accurately predicted, the patient does not currently require acute inpatient psychiatric care and does not currently meet [facility] involuntary commitment criteria.  Substance Abuse History: Current substance abuse: No     Past Psychiatric History:   Previous psychological history is significant for depression Outpatient Providers:Hospice grief counselors History of Psych Hospitalization: No  Psychological Testing:  N/A    Abuse History:  Victim of: No.,  N/A    Report needed: No. Victim of Neglect:No. Perpetrator of  N/A   Witness /  Exposure to Domestic Violence: No   Protective Services Involvement: No  Witness to [employer] Violence:  No   Family History:  Family History  Problem Relation Age of Onset   Hypertension Mother    Arthritis Mother    Hypertension Father    Cancer Sister        breast cancer   Arthritis Sister    Breast cancer Sister     Living situation: the patient lives with their spouse  Sexual Orientation: Straight  Relationship Status: married  Name of spouse / other:unknown If a parent, number of children / ages:two daughters, 1 son (deceased).  Support Systems: spouse  Museum/gallery curator Stress:  No   Income/Employment/Disability: Actor: No   Educational History: Education:  unknown  Religion/Sprituality/World View: Catholic  Any cultural differences that may affect / interfere with treatment:  not applicable   Recreation/Hobbies: unknown  Stressors: Traumatic event    Strengths: Family  Barriers:  unknown   Legal History: Pending legal issue / charges: The patient has no significant history of legal issues. History of legal issue / charges:  N/A  Medical History/Surgical History: reviewed Past Medical History:  Diagnosis Date   Allergy    Anxiety    Hyperlipidemia    Migraine    Restless leg syndrome     Past Surgical History:  Procedure Laterality Date   ABDOMINAL HYSTERECTOMY      Medications: Current Outpatient Medications  Medication Sig Dispense Refill   atorvastatin (LIPITOR) 20  MG tablet Take 20 mg by mouth daily.     rOPINIRole (REQUIP) 1 MG tablet Take 1 mg by mouth 3 (three) times daily.     sertraline (ZOLOFT) 50 MG tablet Take 50 mg by mouth daily.     traZODone (DESYREL) 50 MG tablet Take 50 mg by mouth at bedtime.     No current facility-administered medications for this visit.    No Known Allergies Initial note: Patient states that her son committed suicide last year on August 24th. He was 8, married  with 2 children. He had depression. Cindy Combs also suffers depression.  Her daughter lives in [place] and Cindy Combs was there to be with daughter who was about to deliver her first child. First day there, the 22nd, she fell down stairs and broke foot. Grandchild delivered next day and son committed suicide the 24th. She and husband stayed in [place] for 3 days after. The orthopedist saw her right away and she had surgery.   Her son Cindy Combs called Cindy Combs the night the baby was born which was day before he committed suicide. He told her that things were not good and it was his fault. He planned to go to psychiatrist the next day. He committed suicide the next morning at 5:30am. 6 years earlier he was suicidal and they took him to a hospital and got him committed (in [place]). She also has another daughter in [place] and the daughter that was in [place] is now in [place]. She says they do not have a great relationship with daughter in law so they do not see the kids as often as they would like. The daughter is now engaged and it is "difficult" because it is so quick. Cindy Combs' kids are 41 and 6.  She states this has been a "horrible" year. Struggled to get out of bed and husband kept cheering her on. She saw a grief counselor and says it is a roller coaster of emotions all year. She keeps trying to remind herself that he was in so much pain and needed to be at peace. He was very close with the entire family. He was the oldest of her children. 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 Very bright and a "big smile". His depression was most pronounced after college. She saw 2 different grief counselors and hasnt been there the past month. She has tried to get appointment for a long time. She feels that her husband does not show emotion and it sometimes makes her feel that he is not grieving. Both retired last year. She worked for [employer] and was at [employer] for 36 years before that. She retired  the 11-Aug-2022 before Cindy Combs' death. Her husband retired the December after she retired. She baby sits "a lot". She and husband took a cruise to [place] and they enjoy travel. She has trouble reading due to poor concentration.  Her depression dates back to her early 54's. She was treated with therapy but not medication. In therapy off and on for may years. Never any long term relationships with therapists. Currently takes Trazadone to sleep (for years) and Desvenlafaxine (174m.) which she has taken since start of August. Brand name Pristiq.  Aripiprazole (Abilify)was recently added. [facility]rist [facility]. She has been treated there for 2 months. Previously on Zoloft for a long time. Prior to CGerald Combs death her depression was being managed well with medication. She struggled in that she had "no part" in  Cindy Combs' funeral. Cindy Combs belongs to [employer] and is observant. Says she has lately been mad at G-d and hasn't been going to church. She has talked to her priest.     Goals: Patient is seeking counseling to reduce symptoms of depression that have been triggered by the traumatic experience of her son's suicide. Further, is seeking strategies to manage her unresolved grief/anger issues. Will utilize insight oriented therapy and family systems approaches to stabalize moods and reduce symptoms. Goal date is 8-24.   Diagnoses:  Major depression, grief, possible PTSD  Plan of Care: outpatient psychotherapy and medication management  Session Note: She states she is having "more good days than bad". This weekend was the [facility] walk/run which they did last year and it brought many bad memories. She was gratified to have all of the family together to be there for [place]. It was all bitter sweet. When everyone left on Sunday, it felt like a "let-down". She is trying to keep herself busy and build back. She has started feeling guilty when she has a good day. Normalized this experienced and  talked about grief and happiness as not being mutually exclusive. There is a support group for mothers who have lost their adult sons. She attends on line and finds it very helpful. She kept a record of her obsessions. She feels she is doing better and when she gets into a pattern of obsessing, she tries to distract herself as early as possible. We talked about  how vulnerable she feels and protective of her surviving children.            Marcelina Morel, PhD  4:10p-5:00p 50 minutes

## 2022-02-02 ENCOUNTER — Ambulatory Visit
Admission: RE | Admit: 2022-02-02 | Discharge: 2022-02-02 | Disposition: A | Discharge status: Home / Self Care | Payer: Medicare HMO | Source: Ambulatory Visit | Attending: Family Medicine | Admitting: Family Medicine

## 2022-02-02 DIAGNOSIS — Z1231 Encounter for screening mammogram for malignant neoplasm of breast: Secondary | ICD-10-CM | POA: Diagnosis not present

## 2022-02-02 DIAGNOSIS — F5101 Primary insomnia: Secondary | ICD-10-CM | POA: Diagnosis not present

## 2022-02-02 DIAGNOSIS — F322 Major depressive disorder, single episode, severe without psychotic features: Secondary | ICD-10-CM | POA: Diagnosis not present

## 2022-02-08 DIAGNOSIS — M542 Cervicalgia: Secondary | ICD-10-CM | POA: Diagnosis not present

## 2022-03-02 DIAGNOSIS — M542 Cervicalgia: Secondary | ICD-10-CM | POA: Diagnosis not present

## 2022-03-14 DIAGNOSIS — F5101 Primary insomnia: Secondary | ICD-10-CM | POA: Diagnosis not present

## 2022-03-14 DIAGNOSIS — F322 Major depressive disorder, single episode, severe without psychotic features: Secondary | ICD-10-CM | POA: Diagnosis not present

## 2022-03-19 DIAGNOSIS — J019 Acute sinusitis, unspecified: Secondary | ICD-10-CM | POA: Diagnosis not present

## 2022-03-19 DIAGNOSIS — B9689 Other specified bacterial agents as the cause of diseases classified elsewhere: Secondary | ICD-10-CM | POA: Diagnosis not present

## 2022-04-03 DIAGNOSIS — J069 Acute upper respiratory infection, unspecified: Secondary | ICD-10-CM | POA: Diagnosis not present

## 2022-04-19 DIAGNOSIS — F5101 Primary insomnia: Secondary | ICD-10-CM | POA: Diagnosis not present

## 2022-04-19 DIAGNOSIS — F322 Major depressive disorder, single episode, severe without psychotic features: Secondary | ICD-10-CM | POA: Diagnosis not present

## 2022-05-14 DIAGNOSIS — F5101 Primary insomnia: Secondary | ICD-10-CM | POA: Diagnosis not present

## 2022-05-14 DIAGNOSIS — F322 Major depressive disorder, single episode, severe without psychotic features: Secondary | ICD-10-CM | POA: Diagnosis not present

## 2022-06-01 IMAGING — MG MM DIGITAL SCREENING BILAT W/ TOMO AND CAD
8 series · 8 of 24 positions shown · non-contrast
Comparison: Previous exam(s).

ACR Breast Density Category a: The breast tissue is almost entirely
fatty.

CLINICAL DATA: Screening.

EXAM:
DIGITAL SCREENING BILATERAL MAMMOGRAM WITH TOMOSYNTHESIS AND CAD
TECHNIQUE: Bilateral screening digital craniocaudal and mediolateral oblique
mammograms were obtained. Bilateral screening digital breast
tomosynthesis was performed. The images were evaluated with
computer-aided detection.

[L CC synth-2D]
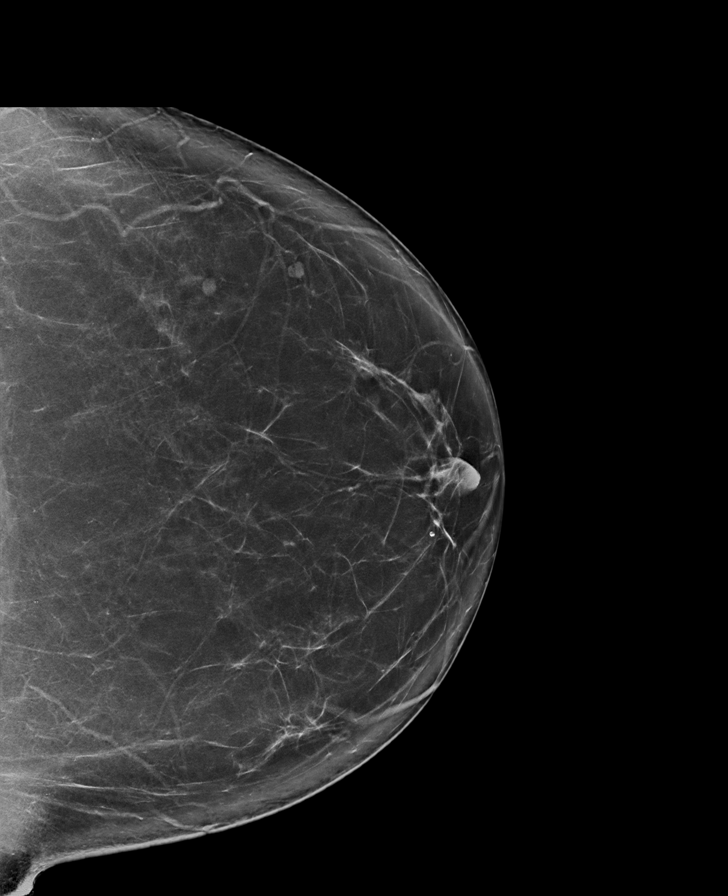

[R MLO synth-2D]
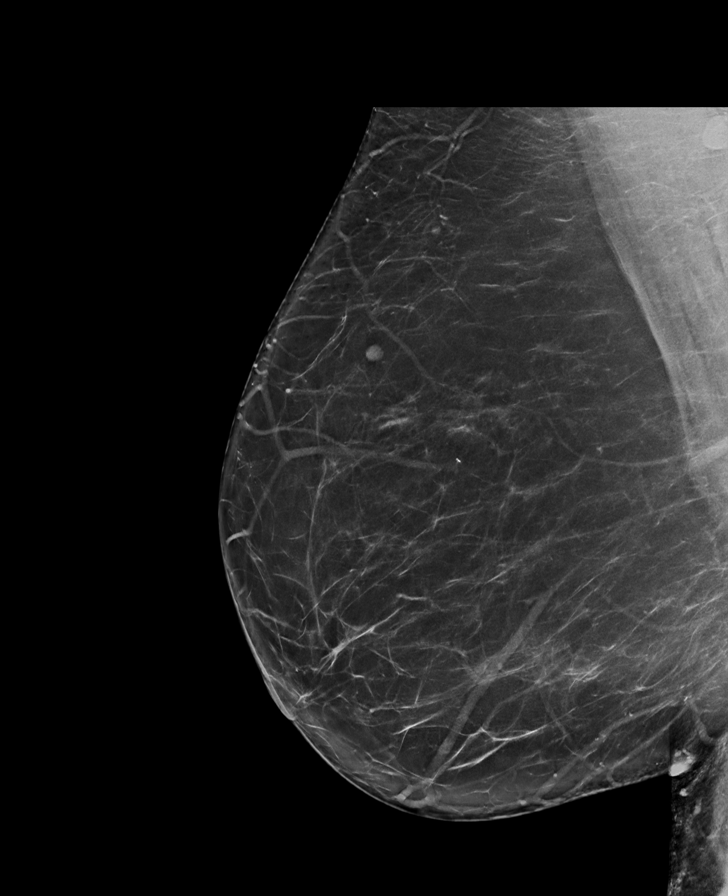

[L MLO synth-2D]
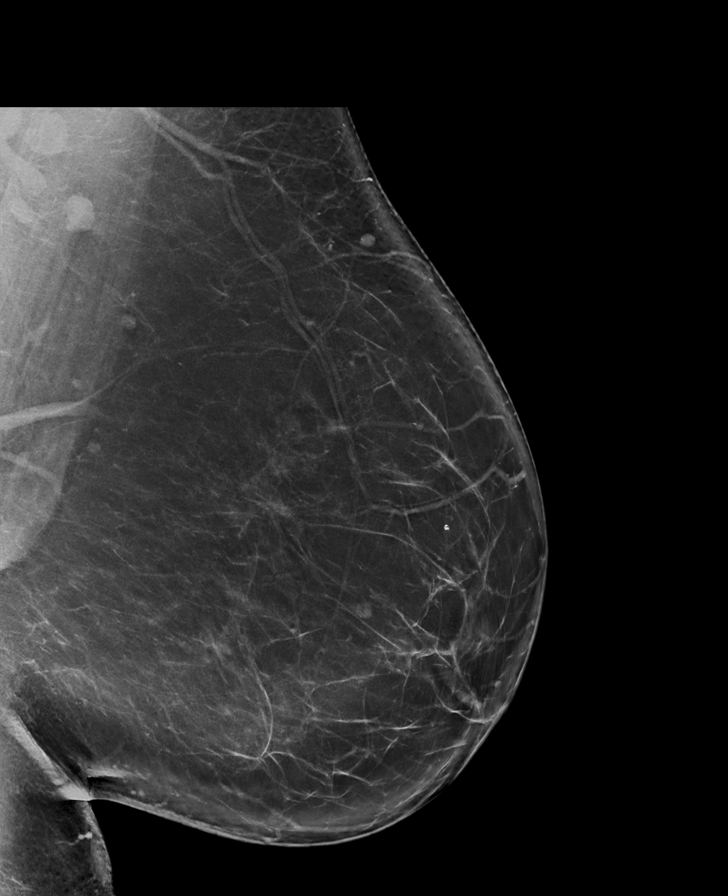

[R CC synth-2D]
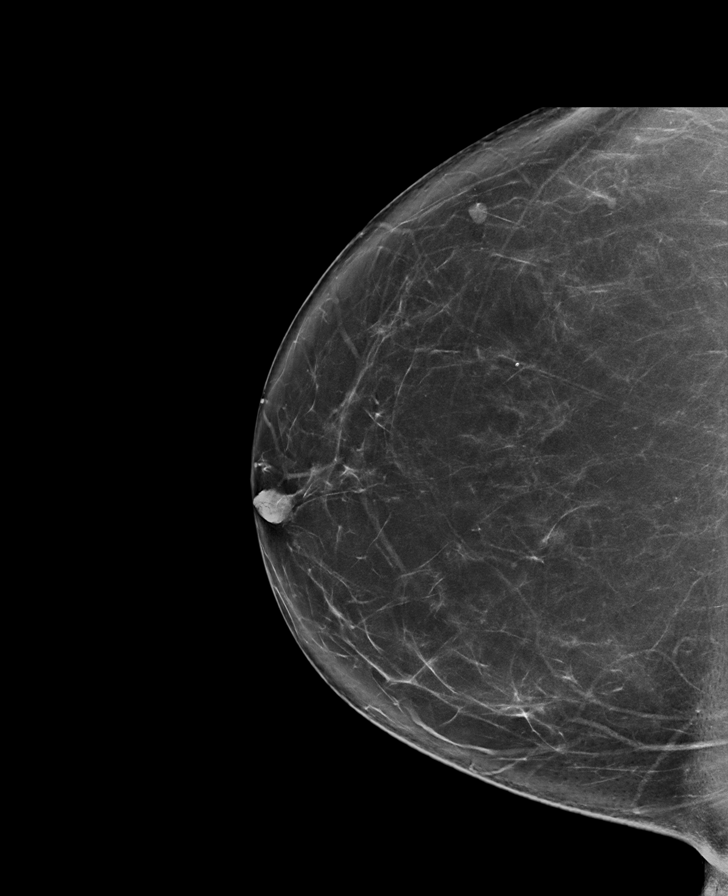

[R MLO tomo · tomo slice 48/95.0]
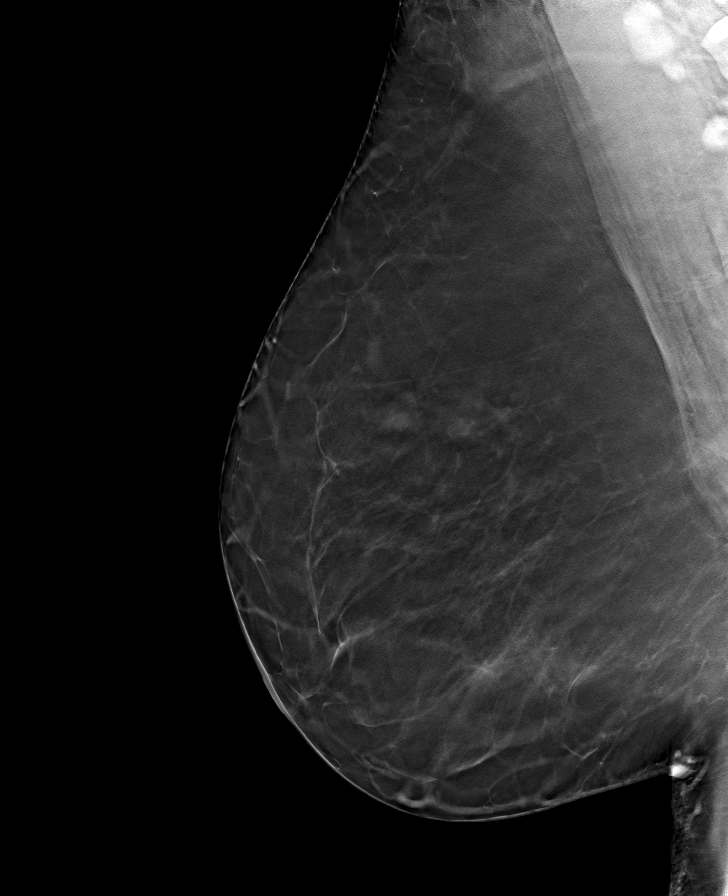

[L MLO tomo · tomo slice 52/103.0]
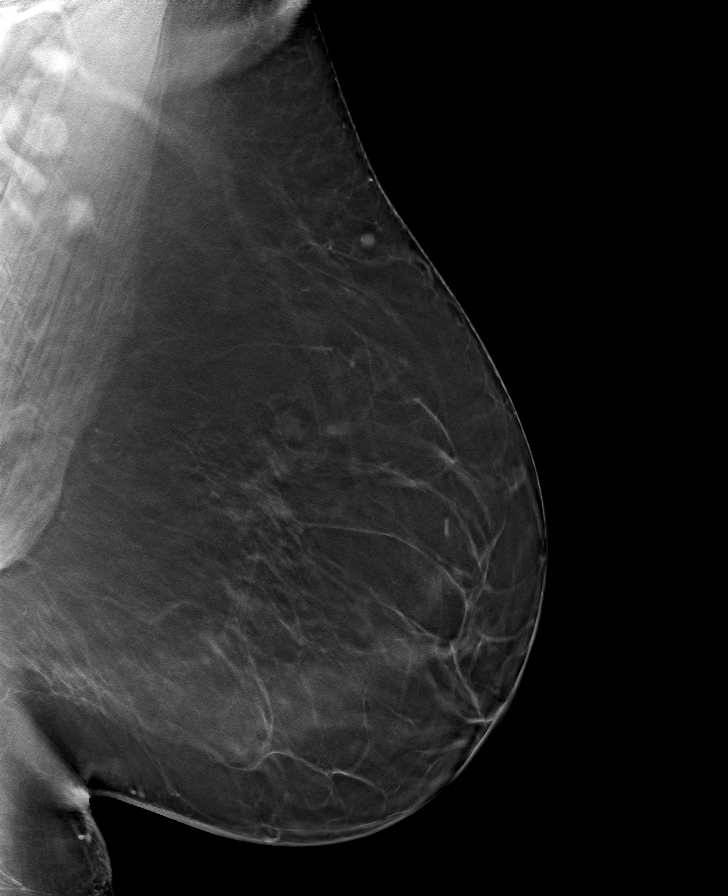

[L CC tomo · tomo slice 46/91.0]
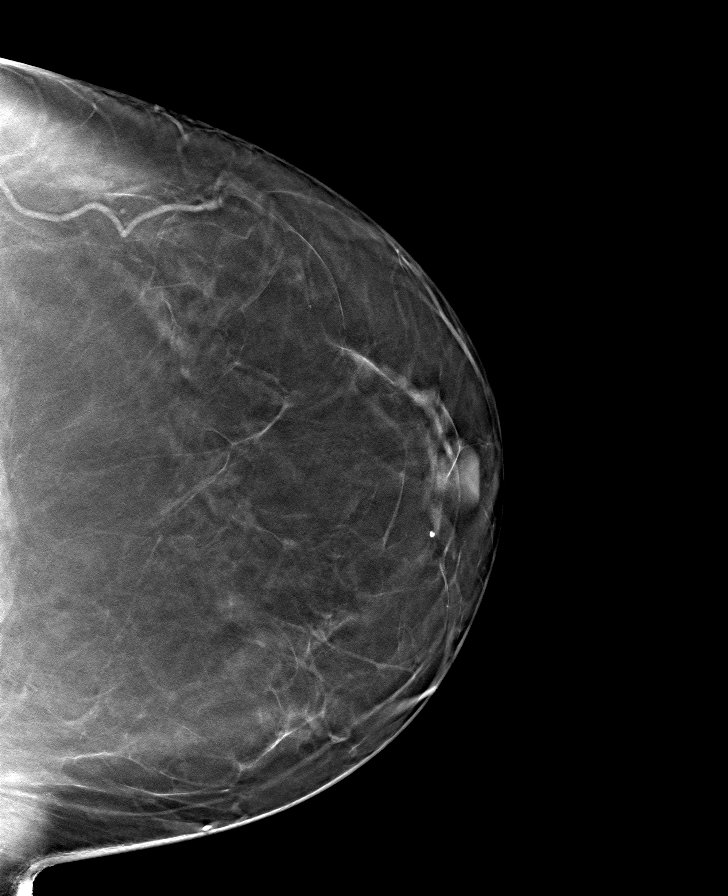

[R CC tomo · tomo slice 44/87.0]
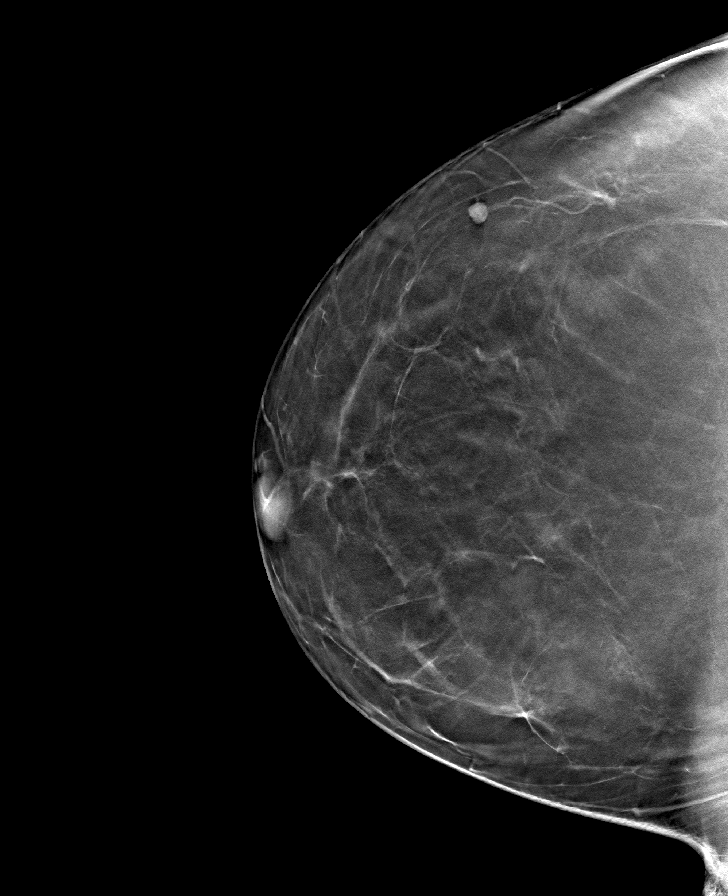

[8 of 24 positions shown; findings below may reference images not displayed]

FINDINGS: There are no findings suspicious for malignancy.
IMPRESSION: No mammographic evidence of malignancy. A result letter of this
screening mammogram will be mailed directly to the patient.

RECOMMENDATION:
Screening mammogram in one year. (Code:0E-3-N98)

BI-RADS CATEGORY  1: Negative.

## 2022-06-26 DIAGNOSIS — F322 Major depressive disorder, single episode, severe without psychotic features: Secondary | ICD-10-CM | POA: Diagnosis not present

## 2022-06-26 DIAGNOSIS — F5101 Primary insomnia: Secondary | ICD-10-CM | POA: Diagnosis not present

## 2022-07-26 DIAGNOSIS — F322 Major depressive disorder, single episode, severe without psychotic features: Secondary | ICD-10-CM | POA: Diagnosis not present

## 2022-07-26 DIAGNOSIS — F5101 Primary insomnia: Secondary | ICD-10-CM | POA: Diagnosis not present

## 2022-08-06 ENCOUNTER — Ambulatory Visit (INDEPENDENT_AMBULATORY_CARE_PROVIDER_SITE_OTHER): Payer: Medicare HMO | Admitting: Psychology

## 2022-08-06 ENCOUNTER — Encounter: Payer: Self-pay | Admitting: Psychology

## 2022-08-06 DIAGNOSIS — F4321 Adjustment disorder with depressed mood: Secondary | ICD-10-CM

## 2022-08-06 DIAGNOSIS — F331 Major depressive disorder, recurrent, moderate: Secondary | ICD-10-CM

## 2022-08-06 NOTE — Progress Notes (Signed)
? ? ? ? ? ? ? ? ? ? ? ? ? ? ?  Ishmael Berkovich, LCMHC ?

## 2022-08-06 NOTE — Progress Notes (Signed)
[facility] Behavioral Health Counselor Initial Adult Exam  Name: Cindy Combs Date: 08/06/2022 MRN: [id number] DOB: 12/13/1955 PCP: Clayborn Heron, MD  Time spent: 9:00am-9:50am  Pt is seen for a virtual video visit via caregility. Pt joins from her home reporting privacy and counselor from her home office.    Guardian/Payee:  self    Paperwork requested: No   Reason for Visit /Presenting Problem: Pt is seeking counseling for grief and depression.  Pt reports it feels like "living ground hog day.  reliving (her son's death) every day. " Pt son died from suicide 2020-12-10.  Pt reports she feels that she is doing better, but would like to do better than she currently is.    Pt shared that she was in [place] at the time of her son's death for the birth of her grandchild.  The night before he daughter was induced- she fell down a flight of stairs breaking her foot- which required surgery. Her daughter had complications hemorrhaging during child birth. The day after her daughter was born her son Thayer Ohm died by suicide.  Pt wasn't able to be involved w/ funeral planning due to her medical needs at the time.  Pt reports also has had a strained relationship w/ his wife and she has remarried.  Pt felt too soon after son's death.  Pt reports that her daughter in law has been good about facilitating seeing the grandchildren which was a concern for her.       Mental Status Exam: Appearance:   Well Groomed     Behavior:  Appropriate  Motor:  Normal  Speech/Language:   Normal Rate  Affect:  Appropriate  Mood:  depressed  Thought process:  normal  Thought content:    WNL  Sensory/Perceptual disturbances:    WNL  Orientation:  oriented to person, place, time/date, and situation  Attention:  Good  Concentration:  Good  Memory:  WNL  Fund of knowledge:   Good  Insight:    Good  Judgment:   Good  Impulse Control:  Good    Reported Symptoms:  Pt reports she think about her son and his death on  and off all day.  Pt reports feeling guilty, asking all the 'What ifs', mad that he did this and also feeling sympathetic as knows what depression is like.  Pt reports sleep disturbance- past 2 months has improved. But still struggles w/ falling asleep and at times if wakes in night trouble falling back asleep.  Pt reports always been a worrier- but increased worry about daughter's and what if something bad happens to them.  Pt had dealt w/ depression for years prior to son's death and had sought tx for.  Pt reports prior to his death her depression was well managed.  Pt reports intrusive thoughts playing over the day of his death.  Pt reports also struggles w/ feeling embarassed- that son's death by suicide.  Pt feels that has lost a lot of friends- haven't seem to keep in touch.  Pt does report improvements as now gets dressed, not sleeping as much to pass time and didn't want to do anything in past, but now engaging and thing more- like cooking .  Pt does report that gained weight over past 1.5 even though lake of appetite.  .     Risk Assessment: Danger to Self:  No Self-injurious Behavior: No Danger to Others: No Duty to Warn:no Physical Aggression / Violence:No  Access to Firearms a concern:  No  Gang Involvement:No  Patient / guardian was educated about steps to take if suicide or homicide risk level increases between visits: n/a While future psychiatric events cannot be accurately predicted, the patient does not currently require acute inpatient psychiatric care and does not currently meet [facility] involuntary commitment criteria.  Substance Abuse History: Current substance abuse: No     Past Psychiatric History:   Previous psychological history is significant for depression Outpatient Providers:Pt reports receiving counseling in the past and medication management.  Pt recently receiving counseling services through [facility] for grief counseling.  They referred for more long term  counseling needs.   History of Psych Hospitalization: No  Psychological Testing:  none    Abuse History:  Victim of: No.,  none    Report needed: No. Victim of Neglect:No. Perpetrator of  none   Witness / Exposure to Domestic Violence: No   Protective Services Involvement: No  Witness to [employer] Violence:  No       Family History:  Family History  Problem Relation Age of Onset   Depression Mother    Hypertension Mother    Arthritis Mother    Hypertension Father    Cancer Sister        breast cancer   Arthritis Sister    Breast cancer Sister    Depression Son    Anxiety disorder Daughter    Anxiety disorder Daughter   Pt grew up w/ mom, dad, 2 older sisters and 1younger brother.   Pt reports she was born in [place], [place] and moved to [place] when she was 67y/o and completed high school here. Pt reported moving at the age was very difficult and a big cultural shift.   Pt reports she is very close w/ one sister.  All her siblings are local.  Her parents are both deceased.  Mom died 4 years agao and dad 4 years ago.  Pt reports both of their deaths were difficult for her.    Living situation: the patient lives with her spouse.    Sexual Orientation: Straight  Relationship Status: married 44 years this July to her husband.   If a parent, number of children / ages: 3 children.  Son was oldest- he died from suicide 09/03/2022at age 76 y/o. He has 2 children age 10y/o and 8y/o and they live in [place].  Her 36y/o daughter lives in [place], [place] and has a 67y/o and a 76month old.  Her 31y/o daughter is living in [place] currently and has a 39 month old.  Pt reports they did live w/ them for 2 months and recognized that they need their own space.     Pt reports she was very close to her son and is close w/ both her daughters.    Support Systems: siblings. Neighborhood friends. Husband "He is my rockWarehouse manager Stress:  No   Income/Employment/Disability: Product manager.   Pt retired in April 2022.  Husband retired a year ago.  Pt and husband plans in retirement are traveling and taking care of grandkids.    Military Service:  none  Educational History: Education:  not reported  Religion/Sprituality/World View: Pt is Catholic and acknowledges that she has been struggling w/her faith following the death of her son.  Pt reports she was "so mad at God and trying to make sense of why"  Any cultural differences that may affect / interfere with treatment:  not applicable   Recreation/Hobbies:  Pt reports she  enjoys travel, reading, gardening.  Pt also plays games on her ipad,  Crossword puzzles and had been into crafts- less interest recent.    Stressors: Loss of her son.  Worry for her daughter's health and gets worried that not as involved with her grandchildren-son's children.    Strengths: Supportive Relationships  Barriers:  none reported   Legal History: Pending legal issue / charges: The patient has no significant history of legal issues. History of legal issue / charges:  none  Medical History/Surgical History: reviewed Past Medical History:  Diagnosis Date   Allergy    Anxiety    Hyperlipidemia    Migraine    Restless leg syndrome   No migraines not 2 years.   Seasonal allergies.  Restless leg syndrome   Past Surgical History:  Procedure Laterality Date   ABDOMINAL HYSTERECTOMY    Surgery on broken foot.    Medications: Current Outpatient Medications  Medication Sig Dispense Refill   desvenlafaxine (PRISTIQ) 100 MG 24 hr tablet Take 100 mg by mouth daily.     propranolol (INDERAL) 40 MG tablet Take 40 mg by mouth 1 day or 1 dose.     atorvastatin (LIPITOR) 20 MG tablet Take 20 mg by mouth daily.     rOPINIRole (REQUIP) 1 MG tablet Take 1 mg by mouth 3 (three) times daily.     sertraline (ZOLOFT) 50 MG tablet Take 50 mg by mouth daily. (Patient not taking: Reported on 08/06/2022)     traZODone (DESYREL) 50 MG tablet Take 100 mg by  mouth at bedtime.     No current facility-administered medications for this visit.  Pt is no longer prescribed zoloft.   No Known Allergies  Diagnoses:  Major depressive disorder, recurrent episode, moderate (HCC)  Complicated grief  Plan of Care: Pt is a 67y/o female seeking counseling for grief of her son's death by suicide Dec 13, 2020.  Pt has struggled w/ depression previously and depression was stable prior to his death.  Pt has been struggling w/ depression, constant thoughts of son, loss of interest, worry for daughter's and guilt.  Pt has been in grief counseling through [facility] and they had referred for counseling outside of their services.  Pt feels that she has made some improvements- increased interest in engaging, sleep improved in past 2 months.  Pt reports good support from sister, husband and neighbors.  Pt to f/u w/ counseling biweekly and continue medication management w/ her PCP.    Individualized Treatment Plan Strengths: enjoys travel, involvement w/ grandkids, reading, gardening  Supports: sister, husband, neighbors   Goal/Needs for Treatment:  In order of importance to patient 1) worth through dealing w/ loss of her son 2) be able to continue enjoying and living life 3) ---   Client Statement of Needs: "Have a more healthier outlook on all of this.  Learn How to work through this and deal w/ my struggles.  Not play his death over and over again in my mind"   Treatment Level:outpt counseling  Symptoms:depression, grief, loss of interest, intrusive thoughts re: his death/that day/days surrounding, sleep disturbance and worry   Client Treatment Preferences:biweekly counseling.  Continue medication management w/ PCP   Healthcare consumer's goal for treatment:  Counselor, Forde Radon, [facility] will support the patient's ability to achieve the goals identified. Cognitive Behavioral Therapy, Assertive Communication/Conflict Resolution Training, Relaxation Training,  ACT, Humanistic and other evidenced-based practices will be used to promote progress towards healthy functioning.   Healthcare consumer will: Actively participate  in therapy, working towards healthy functioning.    *Justification for Continuation/Discontinuation of Goal: R=Revised, O=Ongoing, A=Achieved, D=Discontinued  Goal 1) Begin verbalizing thoughts and feeling surrounding son's death and give safe space for acknowledging feelings and reframing unhealthy thought patterns AEB pt report and therapist observation.   Baseline date 08/06/22 Progress towards goal 0; How Often - Daily Target Date Goal Was reviewed Status Code Progress towards goal/Likert rating  08/06/23                Goal 2) Increase engaging in activities and interactions in relationships that consistent w/ identified values AEB pt report and therapist observation.   Baseline date 08/06/22: Progress towards goal 0; How Often - Daily Target Date Goal Was reviewed Status Code Progress towards goal  08/06/22                  This plan has been reviewed and created by the following participants:  This plan will be reviewed at least every 12 months. Date Behavioral Health Clinician Date Guardian/Patient   08/06/22  [facility] Ophelia Charter [facility] 08/06/22 Verbal Consent Provided                     Forde Radon [facility]

## 2022-08-20 DIAGNOSIS — Z0100 Encounter for examination of eyes and vision without abnormal findings: Secondary | ICD-10-CM | POA: Diagnosis not present

## 2022-08-20 DIAGNOSIS — H524 Presbyopia: Secondary | ICD-10-CM | POA: Diagnosis not present

## 2022-08-20 DIAGNOSIS — H25011 Cortical age-related cataract, right eye: Secondary | ICD-10-CM | POA: Diagnosis not present

## 2022-08-20 DIAGNOSIS — H5203 Hypermetropia, bilateral: Secondary | ICD-10-CM | POA: Diagnosis not present

## 2022-08-20 DIAGNOSIS — H353131 Nonexudative age-related macular degeneration, bilateral, early dry stage: Secondary | ICD-10-CM | POA: Diagnosis not present

## 2022-08-20 DIAGNOSIS — H52223 Regular astigmatism, bilateral: Secondary | ICD-10-CM | POA: Diagnosis not present

## 2022-08-21 ENCOUNTER — Ambulatory Visit (INDEPENDENT_AMBULATORY_CARE_PROVIDER_SITE_OTHER): Payer: Medicare HMO | Admitting: Psychology

## 2022-08-21 DIAGNOSIS — F331 Major depressive disorder, recurrent, moderate: Secondary | ICD-10-CM

## 2022-08-21 DIAGNOSIS — F4321 Adjustment disorder with depressed mood: Secondary | ICD-10-CM

## 2022-08-21 NOTE — Progress Notes (Signed)
[place] Behavioral Health Counselor/Therapist Progress Note  Patient ID: Cindy Combs Combs, MRN: [id number],    Date: 08/21/2022  Time Spent: 12:04pm-12:48pm    Treatment Type: Individual Therapy  Pt is seen for a virtual visit via caregility.  Pt joins from her home, reporting privacy, and counselor from her home office.    Reported Symptoms: sadness, grief, engaging w/ friends/family as positive  Mental Status Exam: Appearance:  Well Groomed     Behavior: Appropriate  Motor: Normal  Speech/Language:  Clear and Coherent  Affect: Appropriate  Mood: depressed  Thought process: normal  Thought content:   WNL  Sensory/Perceptual disturbances:   WNL  Orientation: oriented to person, place, time/date, and situation  Attention: Good  Concentration: Good  Memory: WNL  Fund of knowledge:  Good  Insight:   Good  Judgment:  Good  Impulse Control: Good   Risk Assessment: Danger to Self:  No Self-injurious Behavior: No Danger to Others: No Duty to Warn:no Physical Aggression / Violence:No  Access to Firearms a concern: No  Gang Involvement:No   Subjective: Assessed pt current functioning per pt report. Processed w/pt emotions and grief.  Explored w/ pt ways of engaging w/ others and positive outcomes of and encouraging engagement outside of home and w/ activities.  Discussed trauma related to her son's death and related thoughts.  Discussed challenging distortions and reframing.  Encouraged pt to identify common distortions to further explore together.  Pt affect wnl- at times tearful.  Pt discussed positives of seeing granddaughter's event but also grief of son not present.  Pt increased awareness of expressing and allowing self to feel bother emotions.  Pt discussed mother's day as difficult and validating.  Pt discussed upcoming vacation w/ friends and ways to work through anticipated triggers for grief.  Pt expressed struggled w/ reliving that day and the guilt, what ifs and  embarrassment that comes up.  Pt recognizes related thoughts and naming as distortions.    Interventions: Cognitive Behavioral Therapy and supportive  Diagnosis:Major depressive disorder, recurrent episode, moderate (HCC)  Complicated grief  Plan: pt to f/u w/ counseling biweekly.  Pt to f/u as scheduled w/ PCP.   Individualized Treatment Plan Strengths: enjoys travel, involvement w/ grandkids, reading, gardening  Supports: sister, husband, neighbors    Goal/Needs for Treatment:  In order of importance to patient 1) worth through dealing w/ loss of her son 2) be able to continue enjoying and living life 3) ---    Client Statement of Needs: "Have a more healthier outlook on all of this.  Learn How to work through this and deal w/ my struggles.  Not play his death over and over again in my mind"    Treatment Level:outpt counseling  Symptoms:depression, grief, loss of interest, intrusive thoughts re: his death/that day/days surrounding, sleep disturbance and worry   Client Treatment Preferences:biweekly counseling.  Continue medication management w/ PCP    Healthcare consumer's goal for treatment:   Counselor, Forde Radon, [facility] will support the patient's ability to achieve the goals identified. Cognitive Behavioral Therapy, Assertive Communication/Conflict Resolution Training, Relaxation Training, ACT, Humanistic and other evidenced-based practices will be used to promote progress towards healthy functioning.    Healthcare consumer will: Actively participate in therapy, working towards healthy functioning.     *Justification for Continuation/Discontinuation of Goal: R=Revised, O=Ongoing, A=Achieved, D=Discontinued   Goal 1) Begin verbalizing thoughts and feeling surrounding son's death and give safe space for acknowledging feelings and reframing unhealthy thought patterns AEB pt report and therapist  observation.   Baseline date 08/06/22 Progress towards goal 0; How Often -  Daily Target Date Goal Was reviewed Status Code Progress towards goal/Likert rating  08/06/23                            Goal 2) Increase engaging in activities and interactions in relationships that consistent w/ identified values AEB pt report and therapist observation.   Baseline date 08/06/22: Progress towards goal 0; How Often - Daily Target Date Goal Was reviewed Status Code Progress towards goal  08/06/23                                This plan has been reviewed and created by the following participants:  This plan will be reviewed at least every 12 months. Date Behavioral Health Clinician Date Guardian/Patient   08/06/22               [facility] [name] 08/06/22 Verbal Consent Provided              [name]

## 2022-08-21 NOTE — Progress Notes (Signed)
? ? ? ? ? ? ? ? ? ? ? ? ? ? ?  Cindy Combs, LCMHC ?

## 2022-08-28 DIAGNOSIS — Z6839 Body mass index (BMI) 39.0-39.9, adult: Secondary | ICD-10-CM | POA: Diagnosis not present

## 2022-08-28 DIAGNOSIS — F5104 Psychophysiologic insomnia: Secondary | ICD-10-CM | POA: Diagnosis not present

## 2022-08-28 DIAGNOSIS — Z780 Asymptomatic menopausal state: Secondary | ICD-10-CM | POA: Diagnosis not present

## 2022-08-28 DIAGNOSIS — Z23 Encounter for immunization: Secondary | ICD-10-CM | POA: Diagnosis not present

## 2022-08-28 DIAGNOSIS — E7800 Pure hypercholesterolemia, unspecified: Secondary | ICD-10-CM | POA: Diagnosis not present

## 2022-08-28 DIAGNOSIS — E669 Obesity, unspecified: Secondary | ICD-10-CM | POA: Diagnosis not present

## 2022-08-28 DIAGNOSIS — F339 Major depressive disorder, recurrent, unspecified: Secondary | ICD-10-CM | POA: Diagnosis not present

## 2022-08-28 DIAGNOSIS — G2581 Restless legs syndrome: Secondary | ICD-10-CM | POA: Diagnosis not present

## 2022-08-28 DIAGNOSIS — Z0000 Encounter for general adult medical examination without abnormal findings: Secondary | ICD-10-CM | POA: Diagnosis not present

## 2022-09-03 ENCOUNTER — Ambulatory Visit: Payer: Medicare HMO | Admitting: Psychology

## 2022-09-03 ENCOUNTER — Other Ambulatory Visit: Payer: Self-pay | Admitting: Pain Medicine

## 2022-09-03 DIAGNOSIS — Z780 Asymptomatic menopausal state: Secondary | ICD-10-CM

## 2022-09-20 ENCOUNTER — Ambulatory Visit: Payer: Medicare HMO | Admitting: Psychology

## 2022-09-20 DIAGNOSIS — F322 Major depressive disorder, single episode, severe without psychotic features: Secondary | ICD-10-CM | POA: Diagnosis not present

## 2022-09-20 DIAGNOSIS — F5101 Primary insomnia: Secondary | ICD-10-CM | POA: Diagnosis not present

## 2022-10-17 DIAGNOSIS — H25011 Cortical age-related cataract, right eye: Secondary | ICD-10-CM | POA: Diagnosis not present

## 2022-11-12 DIAGNOSIS — M67813 Other specified disorders of tendon, right shoulder: Secondary | ICD-10-CM | POA: Diagnosis not present

## 2022-11-12 DIAGNOSIS — M25512 Pain in left shoulder: Secondary | ICD-10-CM | POA: Diagnosis not present

## 2022-11-12 DIAGNOSIS — M67814 Other specified disorders of tendon, left shoulder: Secondary | ICD-10-CM | POA: Diagnosis not present

## 2022-11-20 DIAGNOSIS — F322 Major depressive disorder, single episode, severe without psychotic features: Secondary | ICD-10-CM | POA: Diagnosis not present

## 2022-11-20 DIAGNOSIS — F5101 Primary insomnia: Secondary | ICD-10-CM | POA: Diagnosis not present

## 2022-12-21 DIAGNOSIS — F322 Major depressive disorder, single episode, severe without psychotic features: Secondary | ICD-10-CM | POA: Diagnosis not present

## 2022-12-21 DIAGNOSIS — F5101 Primary insomnia: Secondary | ICD-10-CM | POA: Diagnosis not present

## 2022-12-24 ENCOUNTER — Other Ambulatory Visit: Payer: Self-pay | Admitting: Family Medicine

## 2022-12-24 DIAGNOSIS — Z1231 Encounter for screening mammogram for malignant neoplasm of breast: Secondary | ICD-10-CM

## 2022-12-24 DIAGNOSIS — Z6836 Body mass index (BMI) 36.0-36.9, adult: Secondary | ICD-10-CM | POA: Diagnosis not present

## 2022-12-24 DIAGNOSIS — R1319 Other dysphagia: Secondary | ICD-10-CM | POA: Diagnosis not present

## 2022-12-27 ENCOUNTER — Other Ambulatory Visit (HOSPITAL_COMMUNITY): Payer: Self-pay | Admitting: *Deleted

## 2022-12-27 DIAGNOSIS — R1310 Dysphagia, unspecified: Secondary | ICD-10-CM

## 2023-01-15 ENCOUNTER — Ambulatory Visit (HOSPITAL_COMMUNITY)
Admission: RE | Admit: 2023-01-15 | Discharge: 2023-01-15 | Disposition: A | Discharge status: Home / Self Care | Payer: Medicare HMO | Source: Ambulatory Visit | Attending: Family Medicine | Admitting: Family Medicine

## 2023-01-15 DIAGNOSIS — R933 Abnormal findings on diagnostic imaging of other parts of digestive tract: Secondary | ICD-10-CM | POA: Diagnosis not present

## 2023-01-15 DIAGNOSIS — F5101 Primary insomnia: Secondary | ICD-10-CM | POA: Diagnosis not present

## 2023-01-15 DIAGNOSIS — R1310 Dysphagia, unspecified: Secondary | ICD-10-CM

## 2023-01-15 DIAGNOSIS — J040 Acute laryngitis: Secondary | ICD-10-CM | POA: Diagnosis not present

## 2023-01-15 DIAGNOSIS — F322 Major depressive disorder, single episode, severe without psychotic features: Secondary | ICD-10-CM | POA: Diagnosis not present

## 2023-01-15 DIAGNOSIS — R1314 Dysphagia, pharyngoesophageal phase: Secondary | ICD-10-CM | POA: Diagnosis not present

## 2023-01-15 NOTE — Therapy (Signed)
Modified Barium Swallow Study  Patient Details  Name: Cindy Combs MRN: [id number] Date of Birth: 02-13-1956  Today's Date: 01/15/2023  Modified Barium Swallow completed.  Full report located under Chart Review in the Imaging Section.  History of Present Illness Cindy Combs is a 67 y.o. female with anxiety, HLD, migraine, RLS. She presented to this outpatient modified barium swallow study as per recommendation form her PCP due to her complaints of sensation of food getting stuck in throat and feeling that something is in the back of her throat. During discussion prior to this swallow study, patient reported that this sensation has been going on for past 6-9 months and in addition, she has been having laryngitis.   Clinical Impression Patient presents with an oropharyngeal swallow that is WNL as per this modified barium swallow study. She does present with a mild esophageal phase dysphagia. Slowed but complete barium transit occured with all liquid and solid consistencies through PES and upper esophagus with prominent cricopharyngeal bar present and contributing. No swallow initiation delays, no pharyngeal or esophageal residuals observed. Barium tablet transited through pharynx, PES and esophagus without observed delay or difficulty. No penetration or aspiration of liquid or solid consistencies. SLP discussed results and recommendations to patient which were to follow GERD/reflux precautions (try sleeping on pillow wedge or propped up with extra pillow, avoiding spicy and/or acidic foods and to avoid eating within two hours before bedtime. Patient told SLP that plan was for her PCP to determine next steps after reviewing results of this swallow study. SLP is recommending patient f/u with PCP and for GI consultation secondary to suspecting primary esophageal dysphagia.  Swallow Evaluation Recommendations Recommendations: PO diet PO Diet Recommendation: Regular;Thin liquids (Level  0) Liquid Administration via: Straw;Cup Medication Administration: Whole meds with liquid Postural changes: Position pt fully upright for meals;Stay upright 30-60 min after meals Recommended consults: Consider GI consultation      Angela Nevin, MA, CCC-SLP Speech Therapy

## 2023-02-14 ENCOUNTER — Ambulatory Visit: Payer: Medicare HMO

## 2023-02-18 ENCOUNTER — Other Ambulatory Visit: Payer: Medicare HMO

## 2023-02-19 DIAGNOSIS — F322 Major depressive disorder, single episode, severe without psychotic features: Secondary | ICD-10-CM | POA: Diagnosis not present

## 2023-02-19 DIAGNOSIS — F5101 Primary insomnia: Secondary | ICD-10-CM | POA: Diagnosis not present

## 2023-02-21 DIAGNOSIS — F322 Major depressive disorder, single episode, severe without psychotic features: Secondary | ICD-10-CM | POA: Diagnosis not present

## 2023-02-21 DIAGNOSIS — F411 Generalized anxiety disorder: Secondary | ICD-10-CM | POA: Diagnosis not present

## 2023-02-21 DIAGNOSIS — F5101 Primary insomnia: Secondary | ICD-10-CM | POA: Diagnosis not present

## 2023-03-13 ENCOUNTER — Ambulatory Visit
Admission: RE | Admit: 2023-03-13 | Discharge: 2023-03-13 | Disposition: A | Discharge status: Home / Self Care | Payer: Medicare HMO | Source: Ambulatory Visit | Attending: Family Medicine | Admitting: Family Medicine

## 2023-03-13 DIAGNOSIS — Z1231 Encounter for screening mammogram for malignant neoplasm of breast: Secondary | ICD-10-CM | POA: Diagnosis not present

## 2023-03-24 DIAGNOSIS — R059 Cough, unspecified: Secondary | ICD-10-CM | POA: Diagnosis not present

## 2023-03-24 DIAGNOSIS — Z20822 Contact with and (suspected) exposure to covid-19: Secondary | ICD-10-CM | POA: Diagnosis not present

## 2023-03-24 DIAGNOSIS — J029 Acute pharyngitis, unspecified: Secondary | ICD-10-CM | POA: Diagnosis not present

## 2023-04-04 ENCOUNTER — Ambulatory Visit: Payer: Medicare HMO | Admitting: Psychology

## 2023-04-04 DIAGNOSIS — F331 Major depressive disorder, recurrent, moderate: Secondary | ICD-10-CM

## 2023-04-04 DIAGNOSIS — F4381 Prolonged grief disorder: Secondary | ICD-10-CM | POA: Diagnosis not present

## 2023-04-04 DIAGNOSIS — F4321 Adjustment disorder with depressed mood: Secondary | ICD-10-CM

## 2023-04-04 NOTE — Progress Notes (Signed)
 [place] [facility]al [facility] Counselor/Therapist Progress Note  Patient ID: Cindy Combs, MRN: [id number],    Date: 04/04/2023  Time Spent: 8:00am-8:56am    Treatment Type: Individual Therapy  Pt is seen for an in person visit.  Reported Symptoms: depressed mood, intrusive thoughts of son's death, sleep improved  Mental Status Exam: Appearance:  Well Groomed     Behavior: Appropriate  Motor: Normal  Speech/Language:  Clear and Coherent  Affect: Appropriate  Mood: depressed  Thought process: normal  Thought content:   WNL  Sensory/Perceptual disturbances:   WNL  Orientation: oriented to person, place, time/date, and situation  Attention: Good  Concentration: Good  Memory: WNL  Fund of knowledge:  Good  Insight:   Good  Judgment:  Good  Impulse Control: Good   Risk Assessment: Danger to Self:  No Self-injurious Behavior: No Danger to Others: No Duty to Warn:no Physical Aggression / Violence:No  Access to Firearms a concern: No  Gang Involvement:No   Subjective: Assessed pt current functioning per pt report. Discussed return to counseling. Processed w/pt emotions, grief and feeling stuck.  Explored ways of engaging and being intentional about to engage w/ activities and outside the home.  Pt affect congruent w/ report of depressed mood.  Pt reports she didn't return for counseling due to issues w/ billing- insurance denying.  Pt reports that has been assured resolved.  Pt discussed how she feels stuck- depressed, not wanting to do things, feels that reliving her son's death and the weeks that followed- if no busy engaging w/ others.  Pt discussed that holidays were hard. Pt reports some positive interactions w daughter and family.  Pt also reports struggling as has communicated to siblings that she is struggling but not have been reaching out.  Pt acknowledges that benefit when she is engaging w/ things and has signed up for water aerobics,  enjoyed in past.      Interventions: Cognitive Behavioral Therapy and supportive  Diagnosis:Major depressive disorder, recurrent episode, moderate (HCC)  Complicated grief  Plan: pt to f/u w/ counseling biweekly.  Pt to f/u as scheduled w/ PCP.    Individualized Treatment Plan Strengths: enjoys travel, involvement w/ grandkids, reading, gardening  Supports: sister, husband, neighbors    Goal/Needs for Treatment:  In order of importance to patient 1) worth through dealing w/ loss of her son 2) be able to continue enjoying and living life 3) ---    Client Statement of Needs: Have a more healthier outlook on all of this.  Learn How to work through this and deal w/ my struggles.  Not play his death over and over again in my mind    Treatment Level:outpt counseling  Symptoms:depression, grief, loss of interest, intrusive thoughts re: his death/that day/days surrounding, sleep disturbance and worry   Client Treatment Preferences:biweekly counseling.  Continue medication management w/ PCP    Healthcare consumer's goal for treatment:   Counselor, Pat Elicker, [facility] will support the patient's ability to achieve the goals identified. Cognitive Behavioral Therapy, Assertive Communication/Conflict Resolution Training, Relaxation Training, ACT, Humanistic and other evidenced-based practices will be used to promote progress towards healthy functioning.    Healthcare consumer will: Actively participate in therapy, working towards healthy functioning.     *Justification for Continuation/Discontinuation of Goal: R=Revised, O=Ongoing, A=Achieved, D=Discontinued   Goal 1) Begin verbalizing thoughts and feeling surrounding son's death and give safe space for acknowledging feelings and reframing unhealthy thought patterns AEB pt report and therapist observation.   Baseline date 08/06/22 Progress  towards goal 0; How Often - Daily Target Date Goal Was reviewed Status Code Progress towards goal/Likert rating  08/06/23                             Goal 2) Increase engaging in activities and interactions in relationships that consistent w/ identified values AEB pt report and therapist observation.   Baseline date 08/06/22: Progress towards goal 0; How Often - Daily Target Date Goal Was reviewed Status Code Progress towards goal  08/06/23                                This plan has been reviewed and created by the following participants:  This plan will be reviewed at least every 12 months. Date Behavioral Health Clinician Date Guardian/Patient   08/06/22               [facility] [place] Barbarann [facility] 08/06/22 Verbal Consent Provided                BARBARANN [facility]

## 2023-04-05 DIAGNOSIS — F5101 Primary insomnia: Secondary | ICD-10-CM | POA: Diagnosis not present

## 2023-04-05 DIAGNOSIS — F322 Major depressive disorder, single episode, severe without psychotic features: Secondary | ICD-10-CM | POA: Diagnosis not present

## 2023-04-25 ENCOUNTER — Ambulatory Visit: Payer: Medicare HMO | Admitting: Psychology

## 2023-04-25 DIAGNOSIS — F331 Major depressive disorder, recurrent, moderate: Secondary | ICD-10-CM

## 2023-04-25 NOTE — Progress Notes (Signed)
[facility] Counselor/Therapist Progress Note  Patient ID: Cindy Combs, MRN: [id number],    Date: 04/25/2023  Time Spent: 11:00am-11:56am    Treatment Type: Individual Therapy  Pt is seen for an in person visit.  Reported Symptoms: depressed mood, intrusive thoughts of son's death, sleep improved, enjoying water aerobics  Mental Status Exam: Appearance:  Well Groomed     Behavior: Appropriate  Motor: Normal  Speech/Language:  Clear and Coherent  Affect: Appropriate  Mood: depressed  Thought process: normal  Thought content:   WNL  Sensory/Perceptual disturbances:   WNL  Orientation: oriented to person, place, time/date, and situation  Attention: Good  Concentration: Good  Memory: WNL  Fund of knowledge:  Good  Insight:   Good  Judgment:  Good  Impulse Control: Good   Risk Assessment: Danger to Self:  No Self-injurious Behavior: No Danger to Others: No Duty to Warn:no Physical Aggression / Violence:No  Access to Firearms a concern: No  Gang Involvement:No   Subjective: Assessed pt current functioning per pt report. Processed w/pt interactions, interactions, grief and emotions.  Explored positives of joining water aerobics class and how that has benefited her.  Discussed interaction w/ grandchildren and emotions of grief and positive emotions sharing space.  Assisted pt in reframing cognitive distortions and what if thinking.  Discussed self compassion.   Pt affect congruent w/ mood.  Pt reports she has attended her a.m. water aerobics class and has really enjoyed.  Pt recognizes how positive for her.   Pt still struggling w/ rumination on what ifs and relieving the day.  Pt is able to identify a reframe w/ counselor.  Pt discussed how visited her grandson's game and did attend the house after- which pt has avoided in past.  Pt recognizes that ok to have sadness and positive emotion together.  Pt shared worry for grand kids judging dad when become aware of  his death by suicide.  Pt receptive to ideas of self compassion through this process of grief.  Interventions: Cognitive Behavioral Therapy and supportive  Diagnosis:Major depressive disorder, recurrent episode, moderate (HCC)  Plan: pt to f/u w/ monthly counseling.  Pt to f/u as scheduled w/ PCP.    Individualized Treatment Plan Strengths: enjoys travel, involvement w/ grandkids, reading, gardening  Supports: sister, husband, neighbors    Goal/Needs for Treatment:  In order of importance to patient 1) worth through dealing w/ loss of her son 2) be able to continue enjoying and living life 3) ---    Client Statement of Needs: "Have a more healthier outlook on all of this.  Learn How to work through this and deal w/ my struggles.  Not play his death over and over again in my mind"    Treatment Level:outpt counseling  Symptoms:depression, grief, loss of interest, intrusive thoughts re: his death/that day/days surrounding, sleep disturbance and worry   Client Treatment Preferences:biweekly counseling.  Continue medication management w/ PCP    Healthcare consumer's goal for treatment:   Counselor, Forde Radon, [facility] will support the patient's ability to achieve the goals identified. Cognitive Behavioral Therapy, Assertive Communication/Conflict Resolution Training, Relaxation Training, ACT, Humanistic and other evidenced-based practices will be used to promote progress towards healthy functioning.    Healthcare consumer will: Actively participate in therapy, working towards healthy functioning.     *Justification for Continuation/Discontinuation of Goal: R=Revised, O=Ongoing, A=Achieved, D=Discontinued   Goal 1) Begin verbalizing thoughts and feeling surrounding son's death and give safe space for acknowledging feelings and reframing unhealthy  thought patterns AEB pt report and therapist observation.   Baseline date 08/06/22 Progress towards goal 0; How Often - Daily Target Date Goal Was  reviewed Status Code Progress towards goal/Likert rating  08/06/23                            Goal 2) Increase engaging in activities and interactions in relationships that consistent w/ identified values AEB pt report and therapist observation.   Baseline date 08/06/22: Progress towards goal 0; How Often - Daily Target Date Goal Was reviewed Status Code Progress towards goal  08/06/23                                This plan has been reviewed and created by the following participants:  This plan will be reviewed at least every 12 months. Date Behavioral Health Clinician Date Guardian/Patient   08/06/22               [facility] Ophelia Charter [facility] 08/06/22 Verbal Consent Provided               Forde Radon [facility]

## 2023-04-30 DIAGNOSIS — H353131 Nonexudative age-related macular degeneration, bilateral, early dry stage: Secondary | ICD-10-CM | POA: Diagnosis not present

## 2023-05-13 DIAGNOSIS — M7742 Metatarsalgia, left foot: Secondary | ICD-10-CM | POA: Diagnosis not present

## 2023-05-30 ENCOUNTER — Ambulatory Visit: Payer: Medicare HMO | Admitting: Psychology

## 2023-05-31 DIAGNOSIS — F322 Major depressive disorder, single episode, severe without psychotic features: Secondary | ICD-10-CM | POA: Diagnosis not present

## 2023-05-31 DIAGNOSIS — F5101 Primary insomnia: Secondary | ICD-10-CM | POA: Diagnosis not present

## 2023-06-20 ENCOUNTER — Ambulatory Visit: Payer: Medicare HMO | Admitting: Psychology

## 2023-06-28 DIAGNOSIS — F322 Major depressive disorder, single episode, severe without psychotic features: Secondary | ICD-10-CM | POA: Diagnosis not present

## 2023-06-28 DIAGNOSIS — F5101 Primary insomnia: Secondary | ICD-10-CM | POA: Diagnosis not present

## 2023-07-25 ENCOUNTER — Ambulatory Visit: Admitting: Psychology

## 2023-07-25 DIAGNOSIS — F331 Major depressive disorder, recurrent, moderate: Secondary | ICD-10-CM
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
## 2023-08-05 DIAGNOSIS — F5101 Primary insomnia: Secondary | ICD-10-CM | POA: Diagnosis not present

## 2023-08-05 DIAGNOSIS — F322 Major depressive disorder, single episode, severe without psychotic features: Secondary | ICD-10-CM | POA: Diagnosis not present

## 2023-08-20 ENCOUNTER — Ambulatory Visit: Admitting: Psychology

## 2023-09-02 DIAGNOSIS — Z780 Asymptomatic menopausal state: Secondary | ICD-10-CM | POA: Diagnosis not present

## 2023-09-02 DIAGNOSIS — Z6838 Body mass index (BMI) 38.0-38.9, adult: Secondary | ICD-10-CM | POA: Diagnosis not present

## 2023-09-02 DIAGNOSIS — E7800 Pure hypercholesterolemia, unspecified: Secondary | ICD-10-CM | POA: Diagnosis not present

## 2023-09-02 DIAGNOSIS — G2581 Restless legs syndrome: Secondary | ICD-10-CM | POA: Diagnosis not present

## 2023-09-02 DIAGNOSIS — Z789 Other specified health status: Secondary | ICD-10-CM | POA: Diagnosis not present

## 2023-09-02 DIAGNOSIS — Z0000 Encounter for general adult medical examination without abnormal findings: Secondary | ICD-10-CM | POA: Diagnosis not present

## 2023-09-02 DIAGNOSIS — F339 Major depressive disorder, recurrent, unspecified: Secondary | ICD-10-CM | POA: Diagnosis not present

## 2023-09-02 DIAGNOSIS — F5104 Psychophysiologic insomnia: Secondary | ICD-10-CM | POA: Diagnosis not present

## 2023-09-10 DIAGNOSIS — Z780 Asymptomatic menopausal state: Secondary | ICD-10-CM | POA: Diagnosis not present

## 2023-09-16 DIAGNOSIS — F322 Major depressive disorder, single episode, severe without psychotic features: Secondary | ICD-10-CM | POA: Diagnosis not present

## 2023-09-16 DIAGNOSIS — F5101 Primary insomnia: Secondary | ICD-10-CM | POA: Diagnosis not present

## 2023-10-28 DIAGNOSIS — H5203 Hypermetropia, bilateral: Secondary | ICD-10-CM | POA: Diagnosis not present

## 2023-10-28 DIAGNOSIS — H25013 Cortical age-related cataract, bilateral: Secondary | ICD-10-CM | POA: Diagnosis not present

## 2023-10-28 DIAGNOSIS — H353131 Nonexudative age-related macular degeneration, bilateral, early dry stage: Secondary | ICD-10-CM | POA: Diagnosis not present

## 2023-10-28 DIAGNOSIS — H2511 Age-related nuclear cataract, right eye: Secondary | ICD-10-CM | POA: Diagnosis not present

## 2023-10-28 DIAGNOSIS — H524 Presbyopia: Secondary | ICD-10-CM | POA: Diagnosis not present

## 2023-10-28 DIAGNOSIS — H52223 Regular astigmatism, bilateral: Secondary | ICD-10-CM | POA: Diagnosis not present

## 2023-11-20 DIAGNOSIS — F322 Major depressive disorder, single episode, severe without psychotic features: Secondary | ICD-10-CM | POA: Diagnosis not present

## 2023-11-20 DIAGNOSIS — F5101 Primary insomnia: Secondary | ICD-10-CM | POA: Diagnosis not present

## 2023-12-18 DIAGNOSIS — Z0100 Encounter for examination of eyes and vision without abnormal findings: Secondary | ICD-10-CM | POA: Diagnosis not present

## 2024-01-30 ENCOUNTER — Other Ambulatory Visit: Payer: Self-pay | Admitting: Family Medicine

## 2024-01-30 DIAGNOSIS — Z1231 Encounter for screening mammogram for malignant neoplasm of breast: Secondary | ICD-10-CM

## 2024-03-05 DIAGNOSIS — F5101 Primary insomnia: Secondary | ICD-10-CM | POA: Diagnosis not present

## 2024-03-05 DIAGNOSIS — F322 Major depressive disorder, single episode, severe without psychotic features: Secondary | ICD-10-CM | POA: Diagnosis not present

## 2024-03-17 ENCOUNTER — Ambulatory Visit

## 2024-03-20 ENCOUNTER — Ambulatory Visit

## 2024-04-14 ENCOUNTER — Ambulatory Visit
Admission: RE | Admit: 2024-04-14 | Discharge: 2024-04-14 | Disposition: A | Source: Ambulatory Visit | Attending: Family Medicine | Admitting: Family Medicine

## 2024-04-14 DIAGNOSIS — Z1231 Encounter for screening mammogram for malignant neoplasm of breast: Secondary | ICD-10-CM
